# Patient Record
Sex: Male | Born: 1990 | Race: Black or African American | Hispanic: No | Marital: Single | Smoking: Former smoker
Health system: Southern US, Community
[De-identification: ages and names within clinical notes are randomized; demographics above are authoritative.]

## PROBLEM LIST (undated history)

## (undated) DIAGNOSIS — B20 Human immunodeficiency virus [HIV] disease: Secondary | ICD-10-CM

## (undated) DIAGNOSIS — D849 Immunodeficiency, unspecified: Secondary | ICD-10-CM

## (undated) DIAGNOSIS — Z21 Asymptomatic human immunodeficiency virus [HIV] infection status: Secondary | ICD-10-CM

---
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
## 2010-05-11 ENCOUNTER — Emergency Department: Payer: Self-pay | Admitting: Emergency Medicine

## 2012-02-21 ENCOUNTER — Emergency Department: Payer: Self-pay | Admitting: Emergency Medicine

## 2012-10-09 ENCOUNTER — Emergency Department: Payer: Self-pay | Admitting: Emergency Medicine

## 2013-09-02 ENCOUNTER — Emergency Department: Payer: Self-pay | Admitting: Emergency Medicine

## 2013-09-02 LAB — CBC WITH DIFFERENTIAL/PLATELET
Comment - H1-Com1: NORMAL
HCT: 44.6 % (ref 40.0–52.0)
HGB: 14.7 g/dL (ref 13.0–18.0)
Lymphocytes: 51 %
MCH: 29.9 pg (ref 26.0–34.0)
MCHC: 33 g/dL (ref 32.0–36.0)
MCV: 91 fL (ref 80–100)
Monocytes: 8 %
PLATELETS: 172 10*3/uL (ref 150–440)
RBC: 4.91 10*6/uL (ref 4.40–5.90)
RDW: 13.7 % (ref 11.5–14.5)
Segmented Neutrophils: 34 %
Variant Lymphocyte - H1-Rlymph: 7 %
WBC: 5.5 10*3/uL (ref 3.8–10.6)

## 2013-09-02 LAB — COMPREHENSIVE METABOLIC PANEL
ALT: 16 U/L
Albumin: 3.8 g/dL (ref 3.4–5.0)
Alkaline Phosphatase: 70 U/L
Anion Gap: 4 — ABNORMAL LOW (ref 7–16)
BUN: 13 mg/dL (ref 7–18)
Bilirubin,Total: 0.3 mg/dL (ref 0.2–1.0)
CHLORIDE: 107 mmol/L (ref 98–107)
Calcium, Total: 8.5 mg/dL (ref 8.5–10.1)
Co2: 25 mmol/L (ref 21–32)
Creatinine: 0.94 mg/dL (ref 0.60–1.30)
EGFR (African American): >60
Glucose: 87 mg/dL (ref 65–99)
OSMOLALITY: 271 (ref 275–301)
Potassium: 3.8 mmol/L (ref 3.5–5.1)
SGOT(AST): 13 U/L — ABNORMAL LOW (ref 15–37)
Sodium: 136 mmol/L (ref 136–145)
TOTAL PROTEIN: 8 g/dL (ref 6.4–8.2)

## 2013-09-02 LAB — URINALYSIS, COMPLETE
Bacteria: NONE SEEN
Bilirubin,UR: NEGATIVE
Glucose,UR: NEGATIVE mg/dL (ref 0–75)
KETONE: NEGATIVE
Leukocyte Esterase: NEGATIVE
NITRITE: NEGATIVE
PH: 5 (ref 4.5–8.0)
PROTEIN: NEGATIVE
RBC,UR: 18 /HPF (ref 0–5)
Specific Gravity: 1.027 (ref 1.003–1.030)
Squamous Epithelial: NONE SEEN
WBC UR: 1 /HPF (ref 0–5)

## 2014-07-08 ENCOUNTER — Emergency Department: Payer: Self-pay

## 2014-07-08 ENCOUNTER — Emergency Department
Admission: EM | Admit: 2014-07-08 | Discharge: 2014-07-08 | Disposition: A | Discharge status: Home / Self Care | Payer: Self-pay | Attending: Emergency Medicine | Admitting: Emergency Medicine

## 2014-07-08 ENCOUNTER — Encounter: Payer: Self-pay | Admitting: Urgent Care

## 2014-07-08 DIAGNOSIS — K5900 Constipation, unspecified: Secondary | ICD-10-CM | POA: Insufficient documentation

## 2014-07-08 DIAGNOSIS — Z720 Tobacco use: Secondary | ICD-10-CM | POA: Insufficient documentation

## 2014-07-08 NOTE — ED Notes (Signed)
Patient presents with c/o constipation for over 2 months. Patient states, "Some will come out then some other stuff will come out. When my booty hole tries to close it hurts real bad." Denies rectal bleeding. NOS reported at this time.

## 2014-07-08 NOTE — Discharge Instructions (Signed)
Constipation Constipation is when a person:  Poops (has a bowel movement) less than 3 times a week.  Has a hard time pooping.  Has poop that is dry, hard, or bigger than normal. HOME CARE   Eat foods with a lot of fiber in them. This includes fruits, vegetables, beans, and whole grains such as brown rice.  Avoid fatty foods and foods with a lot of sugar. This includes french fries, hamburgers, cookies, candy, and soda.  If you are not getting enough fiber from food, take products with added fiber in them (supplements).  Drink enough fluid to keep your pee (urine) clear or pale yellow.  Exercise on a regular basis, or as told by your doctor.  Go to the restroom when you feel like you need to poop. Do not hold it.  Only take medicine as told by your doctor. Do not take medicines that help you poop (laxatives) without talking to your doctor first. GET HELP RIGHT AWAY IF:   You have bright red blood in your poop (stool).  Your constipation lasts more than 4 days or gets worse.  You have belly (abdominal) or butt (rectal) pain.  You have thin poop (as thin as a pencil).  You lose weight, and it cannot be explained. MAKE SURE YOU:   Understand these instructions.  Will watch your condition.  Will get help right away if you are not doing well or get worse. Document Released: 06/06/2007 Document Revised: 12/23/2012 Document Reviewed: 09/29/2012 [facility] Patient Information 2015 [place]. This information is not intended to replace advice given to you by your health care provider. Make sure you discuss any questions you have with your health care provider.     INCREASE FLUIDS MIRALAX EVERY DAY WITH WATER, JUICE OR COFFEE FOLLOW UP WITH DR. OH    YOU WILL NEED TO CALL FOR AN APPOINTMENT

## 2014-07-08 NOTE — ED Provider Notes (Signed)
[facility] Emergency Department Provider Note  ____________________________________________  Time seen: 7:35 AM  I have reviewed the triage vital signs and the nursing notes.   HISTORY  Chief Complaint Constipation   HPI Anthony Nunez is a 24 y.o. male is here with complaint of constipation for over 2 months. Patient states his last bowel movement was around midnight prior to his arrival in the emergency room. He states it was normal at that time. He states that he eats plenty of that vegetables and fruitsalong with drinking water. He denies any abdominal pain. He denies any rectal bleeding. He states he has not been seen by provider prior to this morning. Nothing makes it better or worse. Currently he states his discomfort is a 6/10.   History reviewed. No pertinent past medical history.  There are no active problems to display for this patient.   History reviewed. No pertinent past surgical history.  No current outpatient prescriptions on file.  Allergies Review of patient's allergies indicates no known allergies.  No family history on file.  Social History History  Substance Use Topics  . Smoking status: Current Every Day Smoker  . Smokeless tobacco: Not on file  . Alcohol Use: No    Review of Systems Constitutional: No fever/chills Eyes: No visual changes. ENT: No sore throat. Cardiovascular: Denies chest pain. Respiratory: Denies shortness of breath. Gastrointestinal: No abdominal pain.  No nausea, no vomiting.  No diarrhea.  Positive constipation. Genitourinary: Negative for dysuria. Musculoskeletal: Negative for back pain. Skin: Negative for rash. Neurological: Negative for headaches, focal weakness or numbness.  10-point ROS otherwise negative.  ____________________________________________   PHYSICAL EXAM:  VITAL SIGNS: ED Triage Vitals  Enc Vitals Group     BP 07/08/14 0203 128/67 mmHg     Pulse Rate 07/08/14 0203 60     Resp 07/08/14 0203 14     Temp 07/08/14 0203 98.4 F (36.9 C)     Temp Source 07/08/14 0203 Oral     SpO2 07/08/14 0203 99 %     Weight 07/08/14 0203 170 lb (77.111 kg)     Height 07/08/14 0203 5\' 10"  (1.778 m)     Head Cir --      Peak Flow --      Pain Score 07/08/14 0203 6     Pain Loc --      Pain Edu? --      Excl. in GC? --     Constitutional: Alert and oriented. Well appearing and in no acute distress. Eyes: Conjunctivae are normal. PERRL. EOMI. Head: Atraumatic. Nose: No congestion/rhinnorhea. Neck: No stridor.   Cardiovascular: Normal rate, regular rhythm. Grossly normal heart sounds.  Good peripheral circulation. Respiratory: Normal respiratory effort.  No retractions. Lungs CTAB. Gastrointestinal: Soft and nontender. No distention. No abdominal bruits. No CVA tenderness. Bowel sounds active in 4 quadrants 4. Rectal exam was performed. No external hemorrhoids were noted. No stool was noted in the rectal vault. Minimal discomfort. Heme occult was negative. Musculoskeletal: No lower extremity tenderness nor edema.  No joint effusions. Neurologic:  Normal speech and language. No gross focal neurologic deficits are appreciated. Speech is normal. No gait instability. Skin:  Skin is warm, dry and intact. No rash noted. Psychiatric: Mood and affect are normal. Speech and behavior are normal.  ____________________________________________   LABS (all labs ordered are listed, but only abnormal results are displayed)  Labs Reviewed - No data to display  RADIOLOGY  KUB x-ray per radiologist shows no  evidence of excess stool. I, Tommi Rumps, personally viewed and evaluated these images as part of my medical decision making.  ____________________________________________   PROCEDURES  Procedure(s) performed: None  Critical Care performed: No  ____________________________________________   INITIAL IMPRESSION / ASSESSMENT AND PLAN / ED COURSE  Pertinent labs &  imaging results that were available during my care of the patient were reviewed by me and considered in my medical decision making (see chart for details  patient was encouraged to eat more vegetables and fruit. He is also told to increase water and also purchase some MiraLAX over-the-counter to begin taking daily. He was given the name of the gastroenterologist on call to follow-up as well.   FINAL CLINICAL IMPRESSION(S) / ED DIAGNOSES  Final diagnoses:  Constipation, unspecified constipation type      Tommi Rumps, PA-C 07/08/14 1132  Tommi Rumps, PA-C 07/08/14 1133  Sharman Cheek, MD 07/08/14 1535

## 2014-08-03 ENCOUNTER — Emergency Department
Admission: EM | Admit: 2014-08-03 | Discharge: 2014-08-03 | Disposition: A | Discharge status: Home / Self Care | Payer: Self-pay | Attending: Emergency Medicine | Admitting: Emergency Medicine

## 2014-08-03 ENCOUNTER — Encounter: Payer: Self-pay | Admitting: *Deleted

## 2014-08-03 DIAGNOSIS — Z720 Tobacco use: Secondary | ICD-10-CM | POA: Insufficient documentation

## 2014-08-03 DIAGNOSIS — N419 Inflammatory disease of prostate, unspecified: Secondary | ICD-10-CM | POA: Insufficient documentation

## 2014-08-03 LAB — URINALYSIS COMPLETE WITH MICROSCOPIC (ARMC ONLY)
Bacteria, UA: NONE SEEN
Bilirubin Urine: NEGATIVE
Glucose, UA: NEGATIVE mg/dL
Hgb urine dipstick: NEGATIVE
Ketones, ur: NEGATIVE mg/dL
Leukocytes, UA: NEGATIVE
NITRITE: NEGATIVE
PH: 5 (ref 5.0–8.0)
PROTEIN: NEGATIVE mg/dL
SQUAMOUS EPITHELIAL / LPF: NONE SEEN
Specific Gravity, Urine: 1.02 (ref 1.005–1.030)

## 2014-08-03 LAB — CHLAMYDIA/NGC RT PCR (ARMC ONLY)
Chlamydia Tr: NOT DETECTED
N gonorrhoeae: NOT DETECTED

## 2014-08-03 MED ORDER — LIDOCAINE HCL (PF) 1 % IJ SOLN
2.1000 mL | Freq: Once | INTRAMUSCULAR | Status: AC
  Administered 2014-08-03: 2.1 mL
  Filled 2014-08-03: qty 5

## 2014-08-03 MED ORDER — AZITHROMYCIN 250 MG PO TABS
1000.0000 mg | ORAL_TABLET | Freq: Once | ORAL | Status: AC
  Administered 2014-08-03: 1000 mg via ORAL
  Filled 2014-08-03: qty 4

## 2014-08-03 MED ORDER — CEFTRIAXONE SODIUM 1 G IJ SOLR
500.0000 mg | Freq: Once | INTRAMUSCULAR | Status: AC
  Administered 2014-08-03: 500 mg via INTRAMUSCULAR
  Filled 2014-08-03: qty 10

## 2014-08-03 MED ORDER — CIPROFLOXACIN HCL 500 MG PO TABS: 500.0000 mg | ORAL_TABLET | Freq: Two times a day (BID) | ORAL | Status: AC

## 2014-08-03 NOTE — Discharge Instructions (Signed)

## 2014-08-03 NOTE — ED Provider Notes (Signed)
[facility] Emergency Department Provider Note ____________________________________________  Time seen: Approximately 6:57 PM  I have reviewed the triage vital signs and the nursing notes.   HISTORY  Chief Complaint Rectal Pain   HPI Anthony Nunez is a 24 y.o. male who presents to the emergency department for pain in the rectum.He states he was here in July and was told that he likely had hemorrhoids and given prescription for Mirilax. He reports occasional dysuria as well. He reports no exposure to HIV but no other STDs. He states that he has male sexual partners. He denies fever.   History reviewed. No pertinent past medical history.  There are no active problems to display for this patient.   History reviewed. No pertinent past surgical history.  Current Outpatient Rx  Name  Route  Sig  Dispense  Refill  . ciprofloxacin (CIPRO) 500 MG tablet   Oral   Take 1 tablet (500 mg total) by mouth 2 (two) times daily.   28 tablet   0     Allergies Review of patient's allergies indicates no known allergies.  No family history on file.  Social History History  Substance Use Topics  . Smoking status: Current Every Day Smoker  . Smokeless tobacco: Not on file  . Alcohol Use: No    Review of Systems Constitutional: No fever/occasional chills Eyes: No visual changes. ENT: No sore throat. Cardiovascular: Denies chest pain. Respiratory: Denies shortness of breath. Gastrointestinal: No abdominal pain.  No nausea, no vomiting.  No diarrhea.  No constipation. Genitourinary: Positive for dysuria. Pain is inside the rectum and feels like a very dull ache. Musculoskeletal: Negative for back pain. Skin: Negative for rash. Neurological: Negative for headaches, focal weakness or numbness.  10-point ROS otherwise negative.  ____________________________________________   PHYSICAL EXAM:  VITAL SIGNS: ED Triage Vitals  Enc Vitals Group     BP  08/03/14 1723 159/90 mmHg     Pulse Rate 08/03/14 1723 60     Resp 08/03/14 1723 16     Temp 08/03/14 1723 98.5 F (36.9 C)     Temp Source 08/03/14 1723 Oral     SpO2 08/03/14 1723 100 %     Weight 08/03/14 1723 165 lb (74.844 kg)     Height 08/03/14 1723  (1.778 m)     Head Cir --      Peak Flow --      Pain Score 08/03/14 1724 8     Pain Loc --      Pain Edu? --      Excl. in GC? --     Constitutional: Alert and oriented. Well appearing and in no acute distress. Eyes: Conjunctivae are normal. PERRL. EOMI. Head: Atraumatic. Nose: No congestion/rhinnorhea. Mouth/Throat: Mucous membranes are moist.  Oropharynx non-erythematous. Neck: No stridor.   Cardiovascular: Normal rate, regular rhythm. Grossly normal heart sounds.  Good peripheral circulation. Respiratory: Normal respiratory effort.  No retractions. Lungs CTAB. Gastrointestinal: Soft and nontender. No distention. No abdominal bruits. No CVA tenderness. Rectal: Prostate tenderness on rectal exam. Musculoskeletal: No lower extremity tenderness nor edema.  No joint effusions. Neurologic:  Normal speech and language. No gross focal neurologic deficits are appreciated. No gait instability. Skin:  Skin is warm, dry and intact. No rash noted. Psychiatric: Mood and affect are normal. Speech and behavior are normal.  ____________________________________________   LABS (all labs ordered are listed, but only abnormal results are displayed)  Labs Reviewed  URINALYSIS COMPLETEWITH MICROSCOPIC ([facility] ONLY) -  Abnormal; Notable for the following:    Color, Urine YELLOW (*)    APPearance CLEAR (*)    All other components within normal limits  CHLAMYDIA/NGC RT PCR ([facility] ONLY)   ____________________________________________  EKG   ____________________________________________  RADIOLOGY   ____________________________________________   PROCEDURES  Procedure(s) performed: None  Critical Care performed:  No  ____________________________________________   INITIAL IMPRESSION / ASSESSMENT AND PLAN / ED COURSE  Pertinent labs & imaging results that were available during my care of the patient were reviewed by me and considered in my medical decision making (see chart for details).  Patient was treated with 1 g of azithromycin and 500 mg of Rocephin IM in the emergency department. He will also be started on ciprofloxacin 500 mg twice a day for the next 2 weeks. He is to follow up with urology for symptoms that are not improving over the next few days. He is to return to the emergency department for symptoms that change or worsen if he isn't able to schedule an appointment. ____________________________________________   FINAL CLINICAL IMPRESSION(S) / ED DIAGNOSES  Final diagnoses:  Prostatitis, unspecified prostatitis type      Chinita Pester, FNP 08/03/14 2159  Minna Antis, MD 08/03/14 2229

## 2014-08-03 NOTE — ED Notes (Signed)
Pt states that he has had on and off rectal pain for about 2 months. Pt states that after he has a bowel movement it hurts worse. Pt seen here for the same previously, symptoms not better.

## 2014-08-03 NOTE — ED Notes (Signed)
Pt reports rectal pain anytime he sneezes or coughs.

## 2014-08-03 NOTE — ED Notes (Signed)
Patient states he was here in July for same. States he does not think it is hemorrhoids. Was told then to increase fiber in diet. States he takes miralax. States pain is still there. Denies any blood in stool.

## 2015-01-22 ENCOUNTER — Encounter: Payer: Self-pay | Admitting: *Deleted

## 2015-01-22 ENCOUNTER — Emergency Department
Admission: EM | Admit: 2015-01-22 | Discharge: 2015-01-22 | Disposition: A | Discharge status: Home / Self Care | Payer: Self-pay | Attending: Emergency Medicine | Admitting: Emergency Medicine

## 2015-01-22 DIAGNOSIS — J40 Bronchitis, not specified as acute or chronic: Secondary | ICD-10-CM | POA: Insufficient documentation

## 2015-01-22 DIAGNOSIS — F17210 Nicotine dependence, cigarettes, uncomplicated: Secondary | ICD-10-CM | POA: Insufficient documentation

## 2015-01-22 MED ORDER — BENZONATATE 100 MG PO CAPS: 100.0000 mg | ORAL_CAPSULE | Freq: Four times a day (QID) | ORAL | Status: DC | PRN

## 2015-01-22 MED ORDER — ALBUTEROL SULFATE HFA 108 (90 BASE) MCG/ACT IN AERS: 2.0000 | INHALATION_SPRAY | Freq: Four times a day (QID) | RESPIRATORY_TRACT | Status: DC | PRN

## 2015-01-22 NOTE — Discharge Instructions (Signed)
Please seek medical attention for any high fevers, chest pain, shortness of breath, change in behavior, persistent vomiting, bloody stool or any other new or concerning symptoms. ° ° °Upper Respiratory Infection, Adult °Most upper respiratory infections (URIs) are a viral infection of the air passages leading to the lungs. A URI affects the nose, throat, and upper air passages. The most common type of URI is nasopharyngitis and is typically referred to as "the common cold." °URIs run their course and usually go away on their own. Most of the time, a URI does not require medical attention, but sometimes a bacterial infection in the upper airways can follow a viral infection. This is called a secondary infection. Sinus and middle ear infections are common types of secondary upper respiratory infections. °Bacterial pneumonia can also complicate a URI. A URI can worsen asthma and chronic obstructive pulmonary disease (COPD). Sometimes, these complications can require emergency medical care and may be life threatening.  °CAUSES °Almost all URIs are caused by viruses. A virus is a type of germ and can spread from one person to another.  °RISKS FACTORS °You may be at risk for a URI if:  °· You smoke.   °· You have chronic heart or lung disease. °· You have a weakened defense (immune) system.   °· You are very young or very old.   °· You have nasal allergies or asthma. °· You work in crowded or poorly ventilated areas. °· You work in health care facilities or schools. °SIGNS AND SYMPTOMS  °Symptoms typically develop 2-3 days after you come in contact with a cold virus. Most viral URIs last 7-10 days. However, viral URIs from the influenza virus (flu virus) can last 14-18 days and are typically more severe. Symptoms may include:  °· Runny or stuffy (congested) nose.   °· Sneezing.   °· Cough.   °· Sore throat.   °· Headache.   °· Fatigue.   °· Fever.   °· Loss of appetite.   °· Pain in your forehead, behind your eyes, and  over your cheekbones (sinus pain). °· Muscle aches.   °DIAGNOSIS  °Your health care provider may diagnose a URI by: °· Physical exam. °· Tests to check that your symptoms are not due to another condition such as: °¨ Strep throat. °¨ Sinusitis. °¨ Pneumonia. °¨ Asthma. °TREATMENT  °A URI goes away on its own with time. It cannot be cured with medicines, but medicines may be prescribed or recommended to relieve symptoms. Medicines may help: °· Reduce your fever. °· Reduce your cough. °· Relieve nasal congestion. °HOME CARE INSTRUCTIONS  °· Take medicines only as directed by your health care provider.   °· Gargle warm saltwater or take cough drops to comfort your throat as directed by your health care provider. °· Use a warm mist humidifier or inhale steam from a shower to increase air moisture. This may make it easier to breathe. °· Drink enough fluid to keep your urine clear or pale yellow.   °· Eat soups and other clear broths and maintain good nutrition.   °· Rest as needed.   °· Return to work when your temperature has returned to normal or as your health care provider advises. You may need to stay home longer to avoid infecting others. You can also use a face mask and careful hand washing to prevent spread of the virus. °· Increase the usage of your inhaler if you have asthma.   °· Do not use any tobacco products, including cigarettes, chewing tobacco, or electronic cigarettes. If you need help quitting, ask your health care   provider. °PREVENTION  °The best way to protect yourself from getting a cold is to practice good hygiene.  °· Avoid oral or hand contact with people with cold symptoms.   °· Wash your hands often if contact occurs.   °There is no clear evidence that vitamin C, vitamin E, echinacea, or exercise reduces the chance of developing a cold. However, it is always recommended to get plenty of rest, exercise, and practice good nutrition.  °SEEK MEDICAL CARE IF:  °· You are getting worse rather than  better.   °· Your symptoms are not controlled by medicine.   °· You have chills. °· You have worsening shortness of breath. °· You have brown or red mucus. °· You have yellow or brown nasal discharge. °· You have pain in your face, especially when you bend forward. °· You have a fever. °· You have swollen neck glands. °· You have pain while swallowing. °· You have white areas in the back of your throat. °SEEK IMMEDIATE MEDICAL CARE IF:  °· You have severe or persistent: °¨ Headache. °¨ Ear pain. °¨ Sinus pain. °¨ Chest pain. °· You have chronic lung disease and any of the following: °¨ Wheezing. °¨ Prolonged cough. °¨ Coughing up blood. °¨ A change in your usual mucus. °· You have a stiff neck. °· You have changes in your: °¨ Vision. °¨ Hearing. °¨ Thinking. °¨ Mood. °MAKE SURE YOU:  °· Understand these instructions. °· Will watch your condition. °· Will get help right away if you are not doing well or get worse. °  °This information is not intended to replace advice given to you by your health care provider. Make sure you discuss any questions you have with your health care provider. °  °Document Released: 06/13/2000 Document Revised: 05/04/2014 Document Reviewed: 03/25/2013 °Elsevier Interactive Patient Education ©2016 [employer] ° °

## 2015-01-22 NOTE — ED Provider Notes (Signed)
[facility] Emergency Department Provider Note   ____________________________________________  Time seen: 0218  I have reviewed the triage vital signs and the nursing notes.   HISTORY  Chief Complaint Cough   History limited by: Not Limited   HPI Anthony Nunez is a 25 y.o. male who presents to the emergency department today because of concerns for cough and congestion. He states that the symptoms have been going on for 1 week. He states that the coughing is intermittent. He has brought up some nonbloody phlegm. He will have some posttussive emesis. He denies any blood in his emesis. He denies any chest pain or significant shortness of breath. He states he does have a some associated right ring decreased. He has tried to use the waxing medications without great success. No fevers. No chest pain.   History reviewed. No pertinent past medical history.  There are no active problems to display for this patient.   History reviewed. No pertinent past surgical history.  No current outpatient prescriptions on file.  Allergies Review of patient's allergies indicates no known allergies.  History reviewed. No pertinent family history.  Social History Social History  Substance Use Topics  . Smoking status: Current Every Day Smoker    Types: Cigarettes  . Smokeless tobacco: None  . Alcohol Use: No    Review of Systems  Constitutional: Negative for fever. Cardiovascular: Negative for chest pain. Positive for cough and congestion. Respiratory: Negative for shortness of breath. Gastrointestinal: Negative for abdominal pain, vomiting and diarrhea. Neurological: Negative for headaches, focal weakness or numbness.  10-point ROS otherwise negative.  ____________________________________________   PHYSICAL EXAM:  VITAL SIGNS: ED Triage Vitals  Enc Vitals Group     BP 01/22/15 0051 136/70 mmHg     Pulse Rate 01/22/15 0051 63     Resp 01/22/15 0051  18     Temp 01/22/15 0051 98.1 F (36.7 C)     Temp Source 01/22/15 0051 Oral     SpO2 01/22/15 0051 95 %     Weight 01/22/15 0051 170 lb (77.111 kg)     Height 01/22/15 0051  (1.778 m)     Head Cir --      Peak Flow --      Pain Score 01/22/15 0052 0   Constitutional: Alert and oriented. Well appearing and in no distress. Eyes: Conjunctivae are normal. PERRL. Normal extraocular movements. ENT   Head: Normocephalic and atraumatic.   Nose: No congestion/rhinnorhea.   Mouth/Throat: Mucous membranes are moist.   Neck: No stridor. Hematological/Lymphatic/Immunilogical: No cervical lymphadenopathy. Cardiovascular: Normal rate, regular rhythm.  No murmurs, rubs, or gallops. Respiratory: Normal respiratory effort without tachypnea nor retractions. Breath sounds are clear and equal bilaterally. No wheezes/rales/rhonchi. Gastrointestinal: Soft and nontender. No distention.  Genitourinary: Deferred Musculoskeletal: Normal range of motion in all extremities. No joint effusions.  No lower extremity tenderness nor edema. Neurologic:  Normal speech and language. No gross focal neurologic deficits are appreciated.  Skin:  Skin is warm, dry and intact. No rash noted. Psychiatric: Mood and affect are normal. Speech and behavior are normal. Patient exhibits appropriate insight and judgment.  ____________________________________________    LABS (pertinent positives/negatives)  None  ____________________________________________   EKG  None  ____________________________________________    RADIOLOGY  None   ____________________________________________   PROCEDURES  Procedure(s) performed: None  Critical Care performed: No  ____________________________________________   INITIAL IMPRESSION / ASSESSMENT AND PLAN / ED COURSE  Pertinent labs & imaging results that were  available during my care of the patient were reviewed by me and considered in my medical  decision making (see chart for details).  Patient presented to the emergency department today because of concerns for cough congestion and some hearing decrease. Physical exam is benign. At this point given the patient is a smoker think bronchitis likely. Will give Tessalon Perles as well as an albuterol inhaler. Primary care follow-up.  ____________________________________________   FINAL CLINICAL IMPRESSION(S) / ED DIAGNOSES  Final diagnoses:  Bronchitis     Phineas Semen, MD 01/22/15 [phone]

## 2015-01-22 NOTE — ED Notes (Signed)
Pt c/o congestion x1week, denies any pain or fever. A&O

## 2015-01-22 NOTE — ED Notes (Signed)
Pt c/o cough that makes him gag and vomit. Pt c/o R ear hearing loss. Pt c/o fever but never took temperature at home.  Pt has had no change in appetite, ambulatory w/o respiratory distress to triage, able to complete full sentences, drove self to ED tonight.

## 2015-02-21 ENCOUNTER — Emergency Department
Admission: EM | Admit: 2015-02-21 | Discharge: 2015-02-21 | Disposition: A | Discharge status: Home / Self Care | Payer: Self-pay | Attending: Emergency Medicine | Admitting: Emergency Medicine

## 2015-02-21 ENCOUNTER — Emergency Department: Payer: Self-pay

## 2015-02-21 ENCOUNTER — Encounter: Payer: Self-pay | Admitting: Emergency Medicine

## 2015-02-21 DIAGNOSIS — R319 Hematuria, unspecified: Secondary | ICD-10-CM | POA: Insufficient documentation

## 2015-02-21 DIAGNOSIS — R809 Proteinuria, unspecified: Secondary | ICD-10-CM | POA: Insufficient documentation

## 2015-02-21 DIAGNOSIS — J208 Acute bronchitis due to other specified organisms: Secondary | ICD-10-CM | POA: Insufficient documentation

## 2015-02-21 DIAGNOSIS — Z87891 Personal history of nicotine dependence: Secondary | ICD-10-CM | POA: Insufficient documentation

## 2015-02-21 LAB — CBC
HCT: 47.7 % (ref 40.0–52.0)
HEMOGLOBIN: 16 g/dL (ref 13.0–18.0)
MCH: 32 pg (ref 26.0–34.0)
MCHC: 33.5 g/dL (ref 32.0–36.0)
MCV: 95.4 fL (ref 80.0–100.0)
Platelets: 162 10*3/uL (ref 150–440)
RBC: 5 MIL/uL (ref 4.40–5.90)
RDW: 14.3 % (ref 11.5–14.5)
WBC: 5.3 10*3/uL (ref 3.8–10.6)

## 2015-02-21 LAB — URINALYSIS COMPLETE WITH MICROSCOPIC (ARMC ONLY)
Bacteria, UA: NONE SEEN
Bilirubin Urine: NEGATIVE
GLUCOSE, UA: NEGATIVE mg/dL
Leukocytes, UA: NEGATIVE
NITRITE: NEGATIVE
Protein, ur: 100 mg/dL — AB
SPECIFIC GRAVITY, URINE: 1.034 — AB (ref 1.005–1.030)
pH: 6 (ref 5.0–8.0)

## 2015-02-21 LAB — COMPREHENSIVE METABOLIC PANEL
ALBUMIN: 4.5 g/dL (ref 3.5–5.0)
ALK PHOS: 82 U/L (ref 38–126)
ALT: 16 U/L — ABNORMAL LOW (ref 17–63)
ANION GAP: 5 (ref 5–15)
AST: 18 U/L (ref 15–41)
BILIRUBIN TOTAL: 0.5 mg/dL (ref 0.3–1.2)
BUN: 14 mg/dL (ref 6–20)
CO2: 29 mmol/L (ref 22–32)
Calcium: 9.5 mg/dL (ref 8.9–10.3)
Chloride: 103 mmol/L (ref 101–111)
Creatinine, Ser: 1.09 mg/dL (ref 0.61–1.24)
GFR calc Af Amer: >60 mL/min (ref >60)
GFR calc non Af Amer: >60 mL/min (ref >60)
GLUCOSE: 119 mg/dL — AB (ref 65–99)
Potassium: 4 mmol/L (ref 3.5–5.1)
Sodium: 137 mmol/L (ref 135–145)
TOTAL PROTEIN: 8.5 g/dL — AB (ref 6.5–8.1)

## 2015-02-21 LAB — RAPID INFLUENZA A&B ANTIGENS
Influenza A (ARMC): NEGATIVE
Influenza B (ARMC): NEGATIVE

## 2015-02-21 LAB — LIPASE, BLOOD: Lipase: 23 U/L (ref 11–51)

## 2015-02-21 MED ORDER — IPRATROPIUM-ALBUTEROL 0.5-2.5 (3) MG/3ML IN SOLN
3.0000 mL | Freq: Once | RESPIRATORY_TRACT | Status: AC
  Administered 2015-02-21: 3 mL via RESPIRATORY_TRACT
  Filled 2015-02-21 (×2): qty 3

## 2015-02-21 MED ORDER — SODIUM CHLORIDE 0.9 % IV BOLUS (SEPSIS)
500.0000 mL | Freq: Once | INTRAVENOUS | Status: AC
  Administered 2015-02-21: 500 mL via INTRAVENOUS

## 2015-02-21 MED ORDER — ACETAMINOPHEN 325 MG PO TABS
650.0000 mg | ORAL_TABLET | Freq: Once | ORAL | Status: AC
  Administered 2015-02-21: 650 mg via ORAL
  Filled 2015-02-21: qty 2

## 2015-02-21 MED ORDER — CLONIDINE HCL 0.1 MG PO TABS
0.1000 mg | ORAL_TABLET | Freq: Once | ORAL | Status: DC
  Filled 2015-02-21 (×2): qty 1

## 2015-02-21 MED ORDER — BENZONATATE 100 MG PO CAPS: 100.0000 mg | ORAL_CAPSULE | Freq: Three times a day (TID) | ORAL | Status: AC | PRN

## 2015-02-21 MED ORDER — ALBUTEROL SULFATE HFA 108 (90 BASE) MCG/ACT IN AERS: 2.0000 | INHALATION_SPRAY | Freq: Four times a day (QID) | RESPIRATORY_TRACT | Status: AC | PRN

## 2015-02-21 NOTE — Discharge Instructions (Signed)
Acute Bronchitis Bronchitis is inflammation of the airways that extend from the windpipe into the lungs (bronchi). The inflammation often causes mucus to develop. This leads to a cough, which is the most common symptom of bronchitis.  In acute bronchitis, the condition usually develops suddenly and goes away over time, usually in a couple weeks. Smoking, allergies, and asthma can make bronchitis worse. Repeated episodes of bronchitis may cause further lung problems.  CAUSES Acute bronchitis is most often caused by the same virus that causes a cold. The virus can spread from person to person (contagious) through coughing, sneezing, and touching contaminated objects. SIGNS AND SYMPTOMS   Cough.   Fever.   Coughing up mucus.   Body aches.   Chest congestion.   Chills.   Shortness of breath.   Sore throat.  DIAGNOSIS  Acute bronchitis is usually diagnosed through a physical exam. Your health care provider will also ask you questions about your medical history. Tests, such as chest X-rays, are sometimes done to rule out other conditions.  TREATMENT  Acute bronchitis usually goes away in a couple weeks. Oftentimes, no medical treatment is necessary. Medicines are sometimes given for relief of fever or cough. Antibiotic medicines are usually not needed but may be prescribed in certain situations. In some cases, an inhaler may be recommended to help reduce shortness of breath and control the cough. A cool mist vaporizer may also be used to help thin bronchial secretions and make it easier to clear the chest.  HOME CARE INSTRUCTIONS  Get plenty of rest.   Drink enough fluids to keep your urine clear or pale yellow (unless you have a medical condition that requires fluid restriction). Increasing fluids may help thin your respiratory secretions (sputum) and reduce chest congestion, and it will prevent dehydration.   Take medicines only as directed by your health care provider.  If  you were prescribed an antibiotic medicine, finish it all even if you start to feel better.  Avoid smoking and secondhand smoke. Exposure to cigarette smoke or irritating chemicals will make bronchitis worse. If you are a smoker, consider using nicotine gum or skin patches to help control withdrawal symptoms. Quitting smoking will help your lungs heal faster.   Reduce the chances of another bout of acute bronchitis by washing your hands frequently, avoiding people with cold symptoms, and trying not to touch your hands to your mouth, nose, or eyes.   Keep all follow-up visits as directed by your health care provider.  SEEK MEDICAL CARE IF: Your symptoms do not improve after 1 week of treatment.  SEEK IMMEDIATE MEDICAL CARE IF:  You develop an increased fever or chills.   You have chest pain.   You have severe shortness of breath.  You have bloody sputum.   You develop dehydration.  You faint or repeatedly feel like you are going to pass out.  You develop repeated vomiting.  You develop a severe headache. MAKE SURE YOU:   Understand these instructions.  Will watch your condition.  Will get help right away if you are not doing well or get worse.   This information is not intended to replace advice given to you by your health care provider. Make sure you discuss any questions you have with your health care provider.   Document Released: 01/26/2004 Document Revised: 01/08/2014 Document Reviewed: 06/10/2012 Elsevier Interactive Patient Education [employer].  Please return immediately if condition worsens. Please contact her primary physician or the physician you were given for  referral. If you have any specialist physicians involved in her treatment and plan please also contact them. Thank you for using [facility]. Please drink plenty of fluids and take over-the-counter Tylenol for fever and body aches. Please contact her primary physician  due to the blood did send her urine and also the protein. This will need to be followed on an outpatient basis.

## 2015-02-21 NOTE — ED Notes (Signed)
Patient ambulatory to triage with steady gait, without difficulty or distress noted; pt reports since last night having body aches, N/V, HA, nonprod cough

## 2015-02-21 NOTE — ED Notes (Signed)
Mask applied to pt with instructions to wear

## 2015-02-21 NOTE — ED Provider Notes (Signed)
Anthony Seen: Approximately 2020  I have reviewed the triage notes  Chief Complaint: Generalized Body Aches; Headache; and Emesis   History of Present Illness: Anthony Nunez is a 25 y.o. male *who presents with a viral-type complaints with nausea with occasional vomiting dry nonproductive cough. He states that he developed a headache today mostly associated with the cough. He presents with a low-grade fever here in the triage area at [facility]. He states that he's had some shortness of breath and has been diagnosed with asthma-type illness before in the past. He states he did well with a previous be prescribed albuterol. He denies any chest or back pain. He denies any abdominal pain. He denies any melena or hematochezia diarrhea.   History reviewed. No pertinent past medical history.  There are no active problems to display for this patient.   History reviewed. No pertinent past surgical history.  History reviewed. No pertinent past surgical history.  Current Outpatient Rx  Name  Route  Sig  Dispense  Refill  . albuterol (PROVENTIL HFA;VENTOLIN HFA) 108 (90 Base) MCG/ACT inhaler   Inhalation   Inhale 2 puffs into the lungs every 6 (six) hours as needed for wheezing or shortness of breath.   1 Inhaler   2   . benzonatate (TESSALON PERLES) Nunez MG capsule   Oral   Take 1 capsule (Nunez mg total) by mouth 3 (three) times daily as needed for cough.   30 capsule   0     Allergies:  Review of patient's allergies indicates no known allergies.  Family History: No family history on file.  Social History: Social History  Substance Use Topics  . Smoking status: Former Smoker    Types: Cigarettes  . Smokeless tobacco: None  . Alcohol Use: No     Review of Systems:   10 point review of systems was performed and was otherwise negative:  Constitutional: No fever Eyes: No visual disturbances ENT: No sore throat, ear pain Cardiac: No chest pain Respiratory: No shortness of  breath, wheezing, or stridor Abdomen: No abdominal pain, no vomiting, No diarrhea Endocrine: No weight loss, No night sweats Extremities: No peripheral edema, cyanosis Skin: No rashes, easy bruising Neurologic: No focal weakness, trouble with speech or swollowing Urologic: No dysuria, Hematuria, or urinary frequency   Physical Exam:  ED Triage Vitals  Enc Vitals Group     BP 02/21/15 1951 142/Nunez mmHg     Pulse Rate 02/21/15 1951 103     Resp 02/21/15 1951 20     Temp 02/21/15 1951 Nunez.3 F (37.9 C)     Temp Source 02/21/15 1951 Oral     SpO2 02/21/15 1951 97 %     Weight 02/21/15 1951 175 lb (79.379 kg)     Height 02/21/15 1951  (1.778 m)     Head Cir --      Peak Flow --      Pain Score 02/21/15 1950 9     Pain Loc --      Pain Edu? --      Excl. in GC? --     General: Awake , Alert , and Oriented times 3; GCS 15 Head: Normal cephalic , atraumatic Eyes: Pupils equal , round, reactive to light Nose/Throat: No nasal drainage, patent upper airway without erythema or exudate.  Neck: Supple, Full range of motion, No anterior adenopathy or palpable thyroid masses Lungs: Clear to ascultation without wheezes , rhonchi, or rales Heart: Regular rate, regular rhythm  without murmurs , gallops , or rubs Abdomen: Soft, non tender without rebound, guarding , or rigidity; bowel sounds positive and symmetric in all 4 quadrants. No organomegaly .        Extremities: 2 plus symmetric pulses. No edema, clubbing or cyanosis Neurologic: normal ambulation, Motor symmetric without deficits, sensory intact Skin: warm, dry, no rashes   Labs:   All laboratory work was reviewed including any pertinent negatives or positives listed below:  Labs Reviewed  COMPREHENSIVE METABOLIC PANEL - Abnormal; Notable for the following:    Glucose, Bld 119 (*)    Total Protein 8.5 (*)    ALT 16 (*)    All other components within normal limits  URINALYSIS COMPLETEWITH MICROSCOPIC ([facility] ONLY) -  Abnormal; Notable for the following:    Color, Urine YELLOW (*)    APPearance CLEAR (*)    Ketones, ur 1+ (*)    Specific Gravity, Urine 1.034 (*)    Hgb urine dipstick 3+ (*)    Protein, ur Nunez (*)    Squamous Epithelial / LPF 0-5 (*)    All other components within normal limits  RAPID INFLUENZA A&B ANTIGENS ([facility] ONLY)  LIPASE, BLOOD  CBC   reviewed the patient's laboratory work shows some protein and some blood in his urine.   Radiology:    Narrative:    CLINICAL DATA: 25 year old with acute onset of cough, fever left-sided chest pain and chest congestion which began yesterday.  EXAM: CHEST 2 VIEW  COMPARISON: 05/11/2010, 08/20/2008.  FINDINGS: Cardiomediastinal silhouette unremarkable, unchanged. Lungs clear. Bronchovascular markings normal. Pulmonary vascularity normal. No visible pleural effusions. No pneumothorax. Visualized bony thorax intact.  IMPRESSION: No acute cardiopulmonary disease. Stable examination.   Electronically Signed By: Hulan Saas M.D.     I personally reviewed the radiologic studies   P  ED Course:  Patient's stay here showed symptomatic improvement. He was given a duo neb which helped clear his wheezing and his pulse ox is stable at this point. I felt I would hold off on steroid therapy based on his urinalysis which likely need some follow-up. Patient may have the onset of nephrotic or nephritic syndrome. He's been advisedwith his primary physician for further follow-up of his blood pressure in his urinalysis. His blood pressure decreased after his temperature came down here in emergency department and I felt he did not need any ongoing blood pressure medication at this Anthony but again requires follow-up. He states he's followed by [facility] and he was advised to give them a call in the morning. His chest x-ray does not show any signs of pneumonia and his influenza testing was negative. Patient was prescribed an inhaler  for his wheezing.    Assessment: Acute viral bronchitis Proteinuria Hematuria   Final Clinical Impression:  Final diagnoses:  Acute viral bronchitis     Plan:  Outpatient management Patient was advised to return immediately if condition worsens. Patient was advised to follow up with their primary care physician or other specialized physicians involved in their outpatient care             Jennye Moccasin, MD 02/21/15 2302

## 2016-07-02 ENCOUNTER — Encounter: Payer: Self-pay | Admitting: Emergency Medicine

## 2016-07-02 ENCOUNTER — Emergency Department: Payer: Self-pay

## 2016-07-02 ENCOUNTER — Emergency Department
Admission: EM | Admit: 2016-07-02 | Discharge: 2016-07-02 | Disposition: A | Discharge status: Home / Self Care | Payer: Self-pay | Attending: Emergency Medicine | Admitting: Emergency Medicine

## 2016-07-02 DIAGNOSIS — R51 Headache: Secondary | ICD-10-CM

## 2016-07-02 DIAGNOSIS — Z87891 Personal history of nicotine dependence: Secondary | ICD-10-CM | POA: Insufficient documentation

## 2016-07-02 DIAGNOSIS — G8929 Other chronic pain: Secondary | ICD-10-CM

## 2016-07-02 DIAGNOSIS — Z79899 Other long term (current) drug therapy: Secondary | ICD-10-CM | POA: Insufficient documentation

## 2016-07-02 DIAGNOSIS — G44229 Chronic tension-type headache, not intractable: Secondary | ICD-10-CM | POA: Insufficient documentation

## 2016-07-02 DIAGNOSIS — B20 Human immunodeficiency virus [HIV] disease: Secondary | ICD-10-CM | POA: Insufficient documentation

## 2016-07-02 LAB — CBC WITH DIFFERENTIAL/PLATELET
Basophils Absolute: 0 10*3/uL (ref 0–0.1)
Basophils Relative: 0 %
EOS ABS: 0.2 10*3/uL (ref 0–0.7)
Eosinophils Relative: 2 %
HEMATOCRIT: 42.9 % (ref 40.0–52.0)
HEMOGLOBIN: 14.9 g/dL (ref 13.0–18.0)
Lymphocytes Relative: 51 %
Lymphs Abs: 3.6 10*3/uL (ref 1.0–3.6)
MCH: 32.6 pg (ref 26.0–34.0)
MCHC: 34.8 g/dL (ref 32.0–36.0)
MCV: 93.8 fL (ref 80.0–100.0)
MONOS PCT: 8 %
Monocytes Absolute: 0.6 10*3/uL (ref 0.2–1.0)
NEUTROS ABS: 2.7 10*3/uL (ref 1.4–6.5)
Neutrophils Relative %: 39 %
Platelets: 160 10*3/uL (ref 150–440)
RBC: 4.57 MIL/uL (ref 4.40–5.90)
RDW: 13.8 % (ref 11.5–14.5)
WBC: 7 10*3/uL (ref 3.8–10.6)

## 2016-07-02 LAB — BASIC METABOLIC PANEL
Anion gap: 4 — ABNORMAL LOW (ref 5–15)
BUN: 14 mg/dL (ref 6–20)
CHLORIDE: 105 mmol/L (ref 101–111)
CO2: 32 mmol/L (ref 22–32)
CREATININE: 0.86 mg/dL (ref 0.61–1.24)
Calcium: 9.4 mg/dL (ref 8.9–10.3)
GFR calc non Af Amer: >60 mL/min (ref >60)
Glucose, Bld: 88 mg/dL (ref 65–99)
POTASSIUM: 3.8 mmol/L (ref 3.5–5.1)
Sodium: 141 mmol/L (ref 135–145)

## 2016-07-02 MED ORDER — DIPHENHYDRAMINE HCL 25 MG PO CAPS
ORAL_CAPSULE | ORAL | Status: AC
  Filled 2016-07-02: qty 1

## 2016-07-02 MED ORDER — SODIUM CHLORIDE 0.9 % IV BOLUS (SEPSIS)
1000.0000 mL | Freq: Once | INTRAVENOUS | Status: AC
  Administered 2016-07-02: 1000 mL via INTRAVENOUS

## 2016-07-02 MED ORDER — DIPHENHYDRAMINE HCL 25 MG PO CAPS: 25.0000 mg | ORAL_CAPSULE | Freq: Once | ORAL | Status: DC

## 2016-07-02 MED ORDER — DIPHENHYDRAMINE HCL 25 MG PO CAPS
25.0000 mg | ORAL_CAPSULE | Freq: Once | ORAL | Status: AC
  Administered 2016-07-02: 25 mg via ORAL

## 2016-07-02 MED ORDER — PROCHLORPERAZINE EDISYLATE 5 MG/ML IJ SOLN
10.0000 mg | Freq: Once | INTRAMUSCULAR | Status: AC
  Administered 2016-07-02: 10 mg via INTRAVENOUS
  Filled 2016-07-02: qty 2

## 2016-07-02 NOTE — ED Notes (Signed)
Checked on patient, patient asleep lightly snoring. Patient state he is feeling better and headache is better.

## 2016-07-02 NOTE — ED Notes (Signed)
Patient reports headache for the last two weeks. Patient states it is on the right side at the temple. States he gets hot and feels like he is "going to fall out". Patient a/o x4

## 2016-07-02 NOTE — ED Provider Notes (Addendum)
[facility] Emergency Department Provider Note  ____________________________________________   I have reviewed the triage vital signs and the nursing notes.   HISTORY  Chief Complaint Headache    HPI Anthony Nunez is a 26 y.o. male who presents today complaining of mild headache for 3 weeks. Patient does work in hot conditions. He states he'll take Tylenol and the headache will go away but sometimes it comes back. Right-sided. He denies any numbness or weakness, he denies anyfever or stiff neck. 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 Patient has had off-and-on headaches exactly like this for over a decade and he states this predates his HIV diagnosis. This is his usual headache. Not worst headache of life. Gradual in onset. No fever. His last CT scan however was in 2009 for these headaches. She states the headaches go away when he takes a nap or relaxes but then comes back when he is at work words hot. Denies any change in vision or focal neurologic deficit. States it is his usual mild headache but he wants to get "checked out".   History reviewed. No pertinent past medical history.  There are no active problems to display for this patient.   History reviewed. No pertinent surgical history.  Prior to Admission medications   Medication Sig Start Date End Date Taking? Authorizing Provider  acetaminophen (TYLENOL) 325 MG tablet Take 650 mg by mouth every 6 (six) hours as needed.   Yes [provider]  emtricitabine-tenofovir (TRUVADA) 200-300 MG tablet Take 1 tablet by mouth daily.   Yes [provider]  albuterol (PROVENTIL HFA;VENTOLIN HFA) 108 (90 Base) MCG/ACT inhaler Inhale 2 puffs into the lungs every 6 (six) hours as needed for wheezing or shortness of breath. Patient not taking: Reported on 07/02/2016 02/21/15   Jennye Moccasin, MD     Allergies Patient has no known allergies.  History reviewed. No pertinent family history.  Social History Social History  Substance Use Topics  . Smoking status: Former Smoker    Types: Cigarettes  . Smokeless tobacco: Never Used  . Alcohol use No    Review of Systems Constitutional: No fever/chills Eyes: No visual changes. ENT: No sore throat. No stiff neck no neck pain Cardiovascular: Denies chest pain. Respiratory: Denies shortness of breath. Gastrointestinal:   no vomiting.  No diarrhea.  No constipation. Genitourinary: Negative for dysuria. Musculoskeletal: Negative lower extremity swelling Skin: Negative for rash. Neurological: Negative for severe headaches, focal weakness or numbness.   ____________________________________________   PHYSICAL EXAM:  VITAL SIGNS: ED Triage Vitals  Enc Vitals Group     BP 07/02/16 1400 132/69     Pulse Rate 07/02/16 1400 (!) 54     Resp --      Temp 07/02/16 1624 98.3 F (36.8 C)     Temp Source 07/02/16 1624 Oral     SpO2 07/02/16 1400 99 %     Weight 07/02/16 1400 185 lb (83.9 kg)     Height 07/02/16 1400 5\' 10"  (1.778 m)     Head Circumference --      Peak Flow --      Pain Score 07/02/16 1400 8     Pain Loc --      Pain Edu? --      Excl. in GC? --     Constitutional: Alert and oriented. Well appearing and in no acute distress. Eyes:  Conjunctivae are normal, Funduscopic exam reveals no evidence of papilledema Head: Atraumatic HEENT: No congestion/rhinnorhea. Mucous membranes are moist.  Oropharynx non-erythematous Neck:   Nontender with no meningismus, no masses, no stridor Cardiovascular: Normal rate, regular rhythm. Grossly normal heart sounds.  Good peripheral circulation. Respiratory: Normal respiratory effort.  No retractions. Lungs CTAB. Abdominal: Soft and nontender. No distention. No guarding no rebound Back:  There is no focal tenderness or step off.  there is no midline tenderness there are no  lesions noted. there is no CVA tenderness Musculoskeletal: No lower extremity tenderness, no upper extremity tenderness. No joint effusions, no DVT signs strong distal pulses no edema Neurologic:  Cranial nerves II through XII are grossly intact 5 out of 5 strength bilateral upper and lower extremity. Finger to nose within normal limits heel to shin within normal limits, speech is normal with no word finding difficulty or dysarthria, reflexes symmetric, pupils are equally round and reactive to light, there is no pronator drift, sensation is normal, vision is intact to confrontation, gait is deferred, there is no nystagmus, normal neurologic exam Skin:  Skin is warm, dry and intact. No rash noted. Psychiatric: Mood and affect are Mildly anxious. Speech and behavior are normal.  ____________________________________________   LABS (all labs ordered are listed, but only abnormal results are displayed)  Labs Reviewed  CBC WITH DIFFERENTIAL/PLATELET  BASIC METABOLIC PANEL  URINALYSIS, COMPLETE ([facility]) WITH MICROSCOPIC   ____________________________________________  EKG  I personally interpreted any EKGs ordered by me or triage  ____________________________________________  RADIOLOGY  I reviewed any imaging ordered by me or triage that were performed during my shift and, if possible, patient and/or family made aware of any abnormal findings. ____________________________________________   PROCEDURES  Procedure(s) performed: None  Procedures  Critical Care performed: None  ____________________________________________   INITIAL IMPRESSION / ASSESSMENT AND PLAN / ED COURSE  Pertinent labs & imaging results that were available during my care of the patient were reviewed by me and considered in my medical decision making (see chart for details).  Patient with history of chronic recurrent headaches of exactly this Friday he states for many years prior to his diagnosis of HIV. His HIV  is very well controlled. He is in no acute distress. He has a normal neurologic exam, nothing at this time to see better likelihood of meningitis or fungal infection of the brain. Low suspicion for meningitis or mass, chronic recurrent headaches with no fever, no neurologic findings, I did do a CT scan of the head which is negative. Patient got Compazine here and felt much better but then he became very anxious and demanded that we pull out the IV. He remains neurologically intact. He could be having some degree of reaction to the Compazine although is not clear. He is having difficulty expressing he just states he feels somewhat anxious and he would like to drink some water. He remains very nontoxic in appearance. I'll give him Benadryl for possible reaction to the Compazine and we will continue to observe him. Really no evidence of anaphylaxis or allergic reaction more consistent with questionable dystonic reaction.   ----------------------------------------- 5:47 PM on 07/02/2016 ----------------------------------------- Patient at this time is no complaint of headache remains very calm and alert at this time and feels 100% better.  ----------------------------------------- 8:17 PM on 07/02/2016 -----------------------------------------  Observe for extended period in the emergency department, patient has no headaches, neurologic exam remains completely normal. He is in no acute distress. He is requesting discharge. We did discuss lumbar  puncture for his headache, did explain to him that there are some disease processes which I cannot rule out without LP. He and I discussed risks benefits and alternatives of LP and he declined. He understands the risk of clotting. He is wide awake and alert at this time. I did advise that if he feels any degree of compromise from having had Benadryl several hours ago he should not drive. However at this time clinically there is no evidence of that. Patient is eager to  go home. We will discharge with close outpatient follow-up with neurology for his chronic recurrent headaches as well as his primary care doctor. Return precautions given and understood.  ____________________________________________   FINAL CLINICAL IMPRESSION(S) / ED DIAGNOSES  Final diagnoses:  None      This chart was dictated using voice recognition software.  Despite best efforts to proofread,  errors can occur which can change meaning.      Jeanmarie PlantMcShane, Ranbir Chew A, MD 07/02/16 1728    Jeanmarie PlantMcShane, Lakiah Dhingra A, MD 07/02/16 [id number]    Jeanmarie PlantMcShane, Sheilla Maris A, MD 07/02/16 2018

## 2016-07-02 NOTE — ED Triage Notes (Signed)
Pt to ed with c/o headache x 2 weeks intermittently.  Pt states they are always on the right side of his head.  Pt alert and oriented and states the pain is relieved by taking a nap.

## 2016-07-02 NOTE — ED Notes (Addendum)
After giving patient compazine patient called out that his IV was hurting and his arm was numb, patient states he was getting hot and felt like he needed to stand up and felt anxious. This RN removed his IV. EDP aware and to beside. Verbal order for oral benadryl.

## 2016-07-02 NOTE — ED Notes (Signed)
Patient states his head is much better. Patient is feeling fine.

## 2020-02-10 ENCOUNTER — Encounter: Payer: Self-pay | Admitting: Emergency Medicine

## 2020-02-10 ENCOUNTER — Other Ambulatory Visit: Payer: Self-pay

## 2020-02-10 ENCOUNTER — Emergency Department
Admission: EM | Admit: 2020-02-10 | Discharge: 2020-02-10 | Disposition: A | Discharge status: Home / Self Care | Payer: Self-pay | Attending: Emergency Medicine | Admitting: Emergency Medicine

## 2020-02-10 DIAGNOSIS — L03011 Cellulitis of right finger: Secondary | ICD-10-CM | POA: Insufficient documentation

## 2020-02-10 DIAGNOSIS — Z87891 Personal history of nicotine dependence: Secondary | ICD-10-CM | POA: Insufficient documentation

## 2020-02-10 MED ORDER — SULFAMETHOXAZOLE-TRIMETHOPRIM 800-160 MG PO TABS: 1.0000 | ORAL_TABLET | Freq: Two times a day (BID) | ORAL | 0 refills | Status: AC

## 2020-02-10 NOTE — ED Notes (Signed)
Pt has redness, pain to right thumb around the nailbed.  Pt states he struck it on the dresser 2 days ago.   Pt alert.

## 2020-02-10 NOTE — ED Provider Notes (Signed)
[facility] Emergency Department Provider Note  ____________________________________________  Time seen: Approximately 4:37 PM  I have reviewed the triage vital signs and the nursing notes.   HISTORY  Chief Complaint Finger Injury    HPI Anthony Nunez is a 30 y.o. male who presents the emergency department for evaluation of swelling to the right thumb.  Patient states that he hit his thumb on the corner of a dresser couple of days ago.  He is now having increasing pain and edema around the medial aspect of the nailbed.  No fevers or chills.  No streaking.  Full range of motion to the digit.  No history of recurrent skin infections.         History reviewed. No pertinent past medical history.  There are no problems to display for this patient.   History reviewed. No pertinent surgical history.  Prior to Admission medications   Medication Sig Start Date End Date Taking? Authorizing Provider  sulfamethoxazole-trimethoprim (BACTRIM DS) 800-160 MG tablet Take 1 tablet by mouth 2 (two) times daily. 02/10/20  Yes Cuthriell, Delorise Royals, PA-C  acetaminophen (TYLENOL) 325 MG tablet Take 650 mg by mouth every 6 (six) hours as needed.    [provider]  albuterol (PROVENTIL HFA;VENTOLIN HFA) 108 (90 Base) MCG/ACT inhaler Inhale 2 puffs into the lungs every 6 (six) hours as needed for wheezing or shortness of breath. Patient not taking: Reported on 07/02/2016 02/21/15   Jennye Moccasin, MD  emtricitabine-tenofovir (TRUVADA) 200-300 MG tablet Take 1 tablet by mouth daily.    [provider]    Allergies Patient has no known allergies.  History reviewed. No pertinent family history.  Social History Social History   Tobacco Use  . Smoking status: Former Smoker    Types: Cigarettes  . Smokeless tobacco: Never Used  Substance Use Topics  . Alcohol use: No  . Drug use: Yes    Types: Marijuana    Comment: last use 01/21/15     Review of  Systems  Constitutional: No fever/chills Eyes: No visual changes. No discharge ENT: No upper respiratory complaints. Cardiovascular: no chest pain. Respiratory: no cough. No SOB. Gastrointestinal: No abdominal pain.  No nausea, no vomiting.  No diarrhea.  No constipation. Musculoskeletal: Pain, swelling along the medial aspect of the right thumbnail Skin: Negative for rash, abrasions, lacerations, ecchymosis. Neurological: Negative for headaches, focal weakness or numbness.  10 System ROS otherwise negative.  ____________________________________________   PHYSICAL EXAM:  VITAL SIGNS: ED Triage Vitals  Enc Vitals Group     BP 02/10/20 1524 137/85     Pulse Rate 02/10/20 1524 94     Resp 02/10/20 1524 15     Temp 02/10/20 1524 98.5 F (36.9 C)     Temp Source 02/10/20 1524 Oral     SpO2 02/10/20 1524 97 %     Weight 02/10/20 1519 185 lb (83.9 kg)     Height 02/10/20 1519 5\' 10"  (1.778 m)     Head Circumference --      Peak Flow --      Pain Score 02/10/20 1520 5     Pain Loc --      Pain Edu? --      Excl. in GC? --      Constitutional: Alert and oriented. Well appearing and in no acute distress. Eyes: Conjunctivae are normal. PERRL. EOMI. Head: Atraumatic. ENT:      Ears:       Nose: No congestion/rhinnorhea.  Mouth/Throat: Mucous membranes are moist.  Neck: No stridor.    Cardiovascular: Normal rate, regular rhythm. Normal S1 and S2.  Good peripheral circulation. Respiratory: Normal respiratory effort without tachypnea or retractions. Lungs CTAB. Good air entry to the bases with no decreased or absent breath sounds. Musculoskeletal: Full range of motion to all extremities. No gross deformities appreciated.  Mild erythema, edema with a small purulent collection identified along the medial and superior aspect of the thumbnail.  No active drainage.  Good range of motion to the digit.  No other concerning findings on physical exam.  Sensation capillary refill intact  to the digit. Neurologic:  Normal speech and language. No gross focal neurologic deficits are appreciated.  Skin:  Skin is warm, dry and intact. No rash noted. Psychiatric: Mood and affect are normal. Speech and behavior are normal. Patient exhibits appropriate insight and judgement.   ____________________________________________   LABS (all labs ordered are listed, but only abnormal results are displayed)  Labs Reviewed - No data to display ____________________________________________  EKG   ____________________________________________  RADIOLOGY   No results found.  ____________________________________________    PROCEDURES  Procedure(s) performed:    Marland KitchenMarland KitchenIncision and Drainage  Date/Time: 02/10/2020 5:33 PM Performed by: Racheal Patches, PA-C Authorized by: Racheal Patches, PA-C   Consent:    Consent obtained:  Verbal   Consent given by:  Patient   Risks discussed:  Bleeding, incomplete drainage and pain Universal protocol:    Procedure explained and questions answered to patient or proxy's satisfaction: yes     Patient identity confirmed:  Verbally with patient Location:    Indications for incision and drainage: paronychia.   Location:  Upper extremity   Upper extremity location:  Finger   Finger location:  R thumb Sedation:    Sedation type:  None Anesthesia:    Anesthesia method:  None Procedure type:    Complexity:  Simple Procedure details:    Incision types:  Stab incision   Wound management:  Probed and deloculated   Drainage:  Purulent   Drainage amount:  Scant   Packing materials:  None Post-procedure details:    Procedure completion:  Tolerated well, no immediate complications Comments:     Paronychia to the right thumb was drained using a 18-gauge needle.      Medications - No data to display   ____________________________________________   INITIAL IMPRESSION / ASSESSMENT AND PLAN / ED COURSE  Pertinent labs &  imaging results that were available during my care of the patient were reviewed by me and considered in my medical decision making (see chart for details).  Review of the [place] CSRS was performed in accordance of the [facility] prior to dispensing any controlled drugs.           Patient's diagnosis is consistent with paronychia.  Patient presented to the emergency department complaining of pain, swelling along the nailbed of the right thumb.  Patient states that he had an injury a couple of days ago but then had some erythema and edema to the area.  Findings are consistent with paronychia.  This was drained at bedside.  Patient was placed on antibiotics.  Follow-up with primary care as needed.. Patient is given ED precautions to return to the ED for any worsening or new symptoms.     ____________________________________________  FINAL CLINICAL IMPRESSION(S) / ED DIAGNOSES  Final diagnoses:  Paronychia of right thumb      NEW MEDICATIONS STARTED DURING THIS VISIT:  ED Discharge Orders  Ordered    sulfamethoxazole-trimethoprim (BACTRIM DS) 800-160 MG tablet  2 times daily        02/10/20 1735              This chart was dictated using voice recognition software/Dragon. Despite best efforts to proofread, errors can occur which can change the meaning. Any change was purely unintentional.    Racheal Patches, PA-C 02/10/20 1735    Delton Prairie, MD 02/10/20 1910

## 2020-02-10 NOTE — ED Triage Notes (Signed)
Pt comes into the ED via POV c/o right thumb injury after hitting the nailbed on the corner of a dresser. Pt has swelling noted around the cuticle but is in NAD.

## 2020-09-15 ENCOUNTER — Other Ambulatory Visit: Payer: Self-pay | Admitting: Family Medicine

## 2020-09-15 NOTE — Progress Notes (Unsigned)
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    Wendi Snipes, FNP

## 2020-10-20 ENCOUNTER — Emergency Department
Admission: EM | Admit: 2020-10-20 | Discharge: 2020-10-20 | Disposition: A | Discharge status: Home / Self Care | Payer: Self-pay | Attending: Emergency Medicine | Admitting: Emergency Medicine

## 2020-10-20 ENCOUNTER — Other Ambulatory Visit: Payer: Self-pay

## 2020-10-20 ENCOUNTER — Emergency Department: Payer: Self-pay

## 2020-10-20 DIAGNOSIS — Z21 Asymptomatic human immunodeficiency virus [HIV] infection status: Secondary | ICD-10-CM | POA: Insufficient documentation

## 2020-10-20 DIAGNOSIS — Z87891 Personal history of nicotine dependence: Secondary | ICD-10-CM | POA: Insufficient documentation

## 2020-10-20 DIAGNOSIS — R42 Dizziness and giddiness: Secondary | ICD-10-CM | POA: Insufficient documentation

## 2020-10-20 DIAGNOSIS — R569 Unspecified convulsions: Secondary | ICD-10-CM | POA: Insufficient documentation

## 2020-10-20 HISTORY — DX: Immunodeficiency, unspecified: D84.9

## 2020-10-20 HISTORY — DX: Asymptomatic human immunodeficiency virus (hiv) infection status: Z21

## 2020-10-20 HISTORY — DX: Human immunodeficiency virus (HIV) disease: B20

## 2020-10-20 LAB — CBC WITH DIFFERENTIAL/PLATELET
Abs Immature Granulocytes: 0.02 10*3/uL (ref 0.00–0.07)
Basophils Absolute: 0 10*3/uL (ref 0.0–0.1)
Basophils Relative: 0 %
Eosinophils Absolute: 0.3 10*3/uL (ref 0.0–0.5)
Eosinophils Relative: 5 %
HCT: 35.3 % — ABNORMAL LOW (ref 39.0–52.0)
Hemoglobin: 12.2 g/dL — ABNORMAL LOW (ref 13.0–17.0)
Immature Granulocytes: 0 %
Lymphocytes Relative: 53 %
Lymphs Abs: 3.2 10*3/uL (ref 0.7–4.0)
MCH: 32.3 pg (ref 26.0–34.0)
MCHC: 34.6 g/dL (ref 30.0–36.0)
MCV: 93.4 fL (ref 80.0–100.0)
Monocytes Absolute: 0.4 10*3/uL (ref 0.1–1.0)
Monocytes Relative: 7 %
Neutro Abs: 2.1 10*3/uL (ref 1.7–7.7)
Neutrophils Relative %: 35 %
Platelets: 246 10*3/uL (ref 150–400)
RBC: 3.78 MIL/uL — ABNORMAL LOW (ref 4.22–5.81)
RDW: 14.7 % (ref 11.5–15.5)
WBC: 6 10*3/uL (ref 4.0–10.5)
nRBC: 0 % (ref 0.0–0.2)

## 2020-10-20 LAB — COMPREHENSIVE METABOLIC PANEL
ALT: 11 U/L (ref 0–44)
AST: 22 U/L (ref 15–41)
Albumin: 3.5 g/dL (ref 3.5–5.0)
Alkaline Phosphatase: 72 U/L (ref 38–126)
Anion gap: 9 (ref 5–15)
BUN: 5 mg/dL — ABNORMAL LOW (ref 6–20)
CO2: 25 mmol/L (ref 22–32)
Calcium: 8.5 mg/dL — ABNORMAL LOW (ref 8.9–10.3)
Chloride: 103 mmol/L (ref 98–111)
Creatinine, Ser: 0.84 mg/dL (ref 0.61–1.24)
GFR, Estimated: >60 mL/min (ref >60)
Glucose, Bld: 113 mg/dL — ABNORMAL HIGH (ref 70–99)
Potassium: 3.6 mmol/L (ref 3.5–5.1)
Sodium: 137 mmol/L (ref 135–145)
Total Bilirubin: 0.4 mg/dL (ref 0.3–1.2)
Total Protein: 6.8 g/dL (ref 6.5–8.1)

## 2020-10-20 NOTE — Discharge Instructions (Signed)
As we discussed please do not drive, go up on the ladders, going any pools or yourself in a situation that might be dangerous to yourself or others if you were to have another seizure until you can follow-up and get cleared by neurology. Please seek medical attention for any high fevers, chest pain, shortness of breath, change in behavior, persistent vomiting, bloody stool or any other new or concerning symptoms.

## 2020-10-20 NOTE — ED Notes (Signed)
Pt. Returned from CT.

## 2020-10-20 NOTE — ED Provider Notes (Signed)
[facility] Emergency Department Provider Note  ____________________________________________   I have reviewed the triage vital signs and the nursing notes.   HISTORY  Chief Complaint Seizures   History limited by: Not Limited   HPI Anthony Nunez is a 30 y.o. male who presents to the emergency department today from work after possible seizure-like episode.  The patient states that he had felt a little bit dizzy while at work earlier today.  He also had some fatigue.  He then does not remember what happened at work although report is that coworkers witnessed him having roughly 5 to 6 minutes of tonic-clonic activity.  Apparently he was postictal for EMS.  Patient does think that he might have bit his tongue.  He denies any headache at the time my exam.  He denies any history of seizures.  Denies any recent alcohol use.  Records reviewed. Per medical record review patient has a history of HIV  Past Medical History:  Diagnosis Date   HIV (human immunodeficiency virus infection) (HCC)    Immune deficiency disorder (HCC)     There are no problems to display for this patient.   No past surgical history on file.  Prior to Admission medications   Medication Sig Start Date End Date Taking? Authorizing Provider  acetaminophen (TYLENOL) 325 MG tablet Take 650 mg by mouth every 6 (six) hours as needed.    [provider]  albuterol (PROVENTIL HFA;VENTOLIN HFA) 108 (90 Base) MCG/ACT inhaler Inhale 2 puffs into the lungs every 6 (six) hours as needed for wheezing or shortness of breath. Patient not taking: Reported on 07/02/2016 02/21/15   Jennye Moccasin, MD  emtricitabine-tenofovir (TRUVADA) 200-300 MG tablet Take 1 tablet by mouth daily.    [provider]  sulfamethoxazole-trimethoprim (BACTRIM DS) 800-160 MG tablet Take 1 tablet by mouth 2 (two) times daily. 02/10/20   Cuthriell, Delorise Royals, PA-C    Allergies Patient has no known  allergies.  No family history on file.  Social History Social History   Tobacco Use   Smoking status: Former    Types: Cigarettes   Smokeless tobacco: Never  Substance Use Topics   Alcohol use: No   Drug use: Yes    Types: Marijuana    Review of Systems Constitutional: No fever/chills Eyes: No visual changes. ENT: Positive for sore tongue. Cardiovascular: Denies chest pain. Respiratory: Denies shortness of breath. Gastrointestinal: No abdominal pain.  No nausea, no vomiting.  No diarrhea.   Genitourinary: Negative for dysuria. Musculoskeletal: Negative for back pain. Skin: Negative for rash. Neurological: Positive for lightheadedness.  ____________________________________________   PHYSICAL EXAM:  VITAL SIGNS: ED Triage Vitals [10/20/20 1644]  Enc Vitals Group     BP      Pulse      Resp      Temp      Temp src      SpO2      Weight      Height      Head Circumference      Peak Flow      Pain Score 7     Pain Loc      Pain Edu?      Excl. in GC?      Constitutional: Alert and oriented.  Eyes: Conjunctivae are normal.  ENT      Head: Normocephalic and atraumatic.      Nose: No congestion/rhinnorhea.      Mouth/Throat: Mucous membranes are moist. Tongue with small  amount of maceration to left side.      Neck: No stridor. Hematological/Lymphatic/Immunilogical: No cervical lymphadenopathy. Cardiovascular: Normal rate, regular rhythm.  No murmurs, rubs, or gallops.  Respiratory: Normal respiratory effort without tachypnea nor retractions. Breath sounds are clear and equal bilaterally. No wheezes/rales/rhonchi. Gastrointestinal: Soft and non tender. No rebound. No guarding.  Genitourinary: Deferred Musculoskeletal: Normal range of motion in all extremities. No lower extremity edema. Neurologic:  Normal speech and language. No gross focal neurologic deficits are appreciated.  Skin:  Skin is warm, dry and intact. No rash noted. Psychiatric: Mood and affect  are normal. Speech and behavior are normal. Patient exhibits appropriate insight and judgment.  ____________________________________________    LABS (pertinent positives/negatives)  CMP wnl except glu 113, BUN 5, ca 8.5 CBC wbc 6.0, hgb 12.2, plt 246  ____________________________________________   EKG  I, Phineas Semen, attending physician, personally viewed and interpreted this EKG  EKG Time: 1654 Rate: 80 Rhythm: sinus rhythm Axis: normal Intervals: qtc 401 QRS: narrow ST changes: no st elevation Impression: normal ekg  ____________________________________________    RADIOLOGY  CT head No acute abnormality  MR brain No acute abnormality  ____________________________________________   PROCEDURES  Procedures  ____________________________________________   INITIAL IMPRESSION / ASSESSMENT AND PLAN / ED COURSE  Pertinent labs & imaging results that were available during my care of the patient were reviewed by me and considered in my medical decision making (see chart for details).   Patient presented to the emergency department today because of concern for seizure like activity.  The patient does not have any history of seizures.  It does appear that he bit his tongue.  Work-up here without concerning findings.  No electrolyte abnormality that would explain seizure.  Did get both a head CT and MRI without any concerning abnormalities.  This point somewhat unclear etiology of the patient's seizure.  I did discuss with patient importance of close neurology follow-up.  Also discussed with patient not driving or doing anything that could be dangerous to himself or others if he was to have another seizure.  ___________________________________________   FINAL CLINICAL IMPRESSION(S) / ED DIAGNOSES  Final diagnoses:  Seizure-like activity (HCC)     Note: This dictation was prepared with Dragon dictation. Any transcriptional errors that result from this process  are unintentional     Phineas Semen, MD 10/20/20 [phone]

## 2020-10-20 NOTE — ED Triage Notes (Signed)
Pt comes via EMs from work with c/o seizure witnessed by coworkers. Per coworkers they stated tonic clonic seizure that last 5-8 minutes.   EMS reports pt was postictal upon arrival and unable to answer questions.  CBG-103 VSS  Pt has hx of HIV and monkey poxs only. No seizure hx.  Pt has 20g in lac. Pt is A*OX4.

## 2022-07-17 IMAGING — MR MR HEAD W/O CM
12 series · 48 of 48 positions shown · non-contrast
Comparison: None.

CLINICAL DATA: Seizure

EXAM:
MRI HEAD WITHOUT CONTRAST
TECHNIQUE: Multiplanar, multiecho pulse sequences of the brain and surrounding
structures were obtained without intravenous contrast.

[Series 5: ax dwi_tracew · axial · 3.0mm · 0.65mm/px · z∈[-92,+59]mm · 3 of 48 slices shown]
[im 1/48]
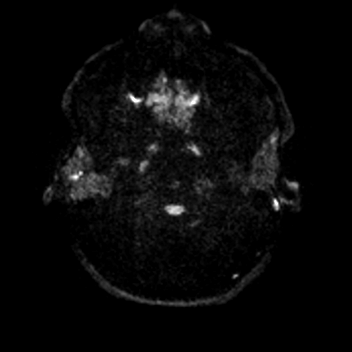
[im 24/48]
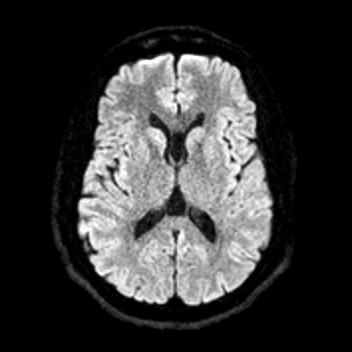
[im 48/48]
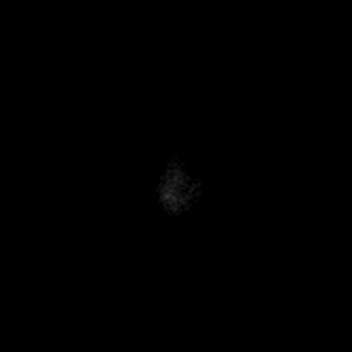

[Series 6: ax dwi_adc · axial · 3.0mm · 0.65mm/px · z∈[-92,+59]mm · 3 of 48 slices shown]
[im 1/48]
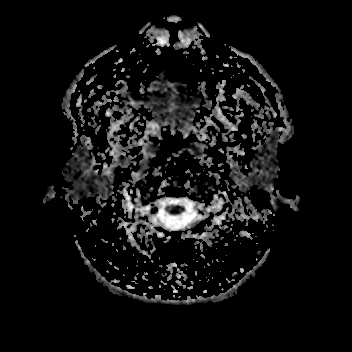
[im 24/48]
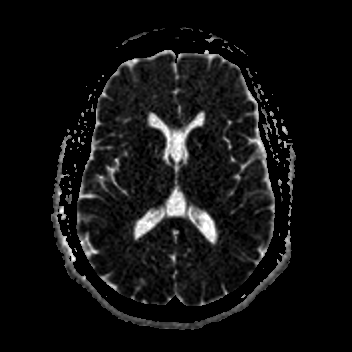
[im 48/48]
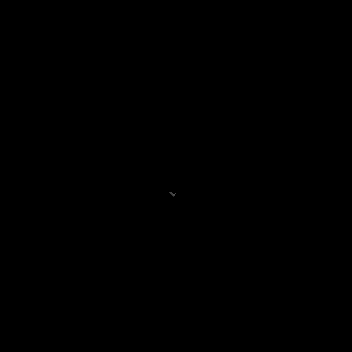

[Series 7: cor dwi_tracew · coronal · 5.0mm · 1.31mm/px · 3 of 40 slices shown]
[im 1/40]
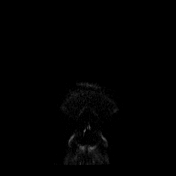
[im 20/40]
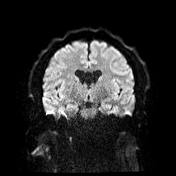
[im 40/40]
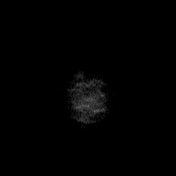

[Series 8: cor dwi_adc · coronal · 5.0mm · 1.31mm/px · 3 of 40 slices shown]
[im 1/40]
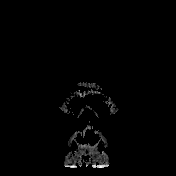
[im 20/40]
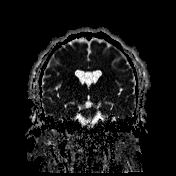
[im 40/40]
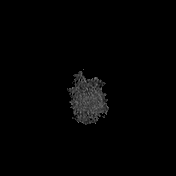

[Series 9: T1 · sagittal · 5.0mm · 0.62mm/px · 2 of 25 slices shown (1 of 2)]
[im 1/25]
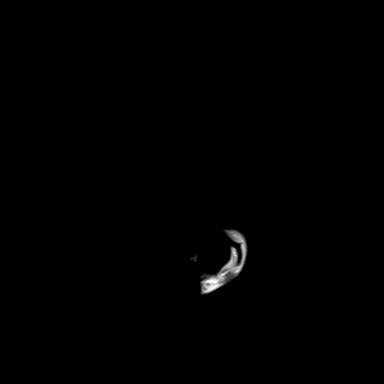
[im 25/25]
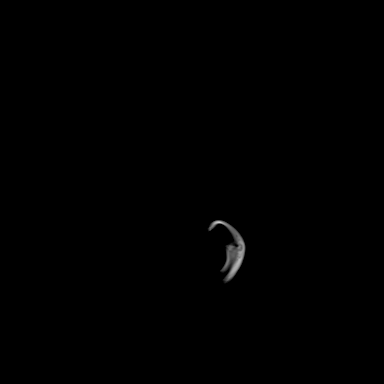

[Series 10: T2 · axial · 3.0mm · 0.45mm/px · z∈[-92,+60]mm · 3 of 48 slices shown (1 of 2)]
[im 1/48]
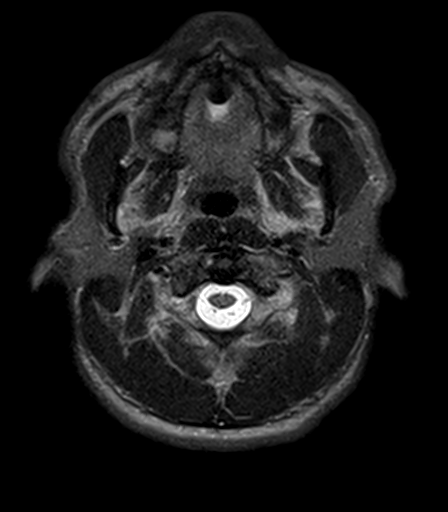
[im 24/48]
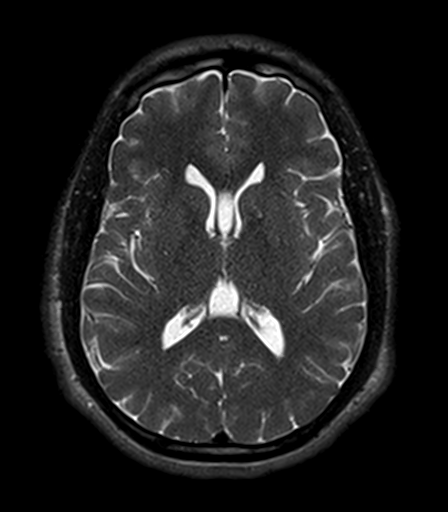
[im 48/48]
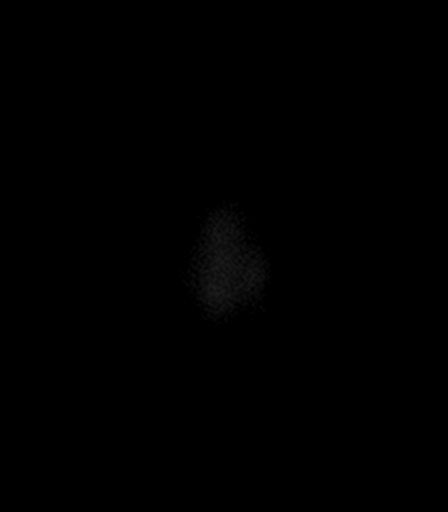

[Series 12: pha_images · axial · 3.0mm · 0.90mm/px · z∈[-103,+71]mm · 4 of 59 slices shown]
[im 1/59]
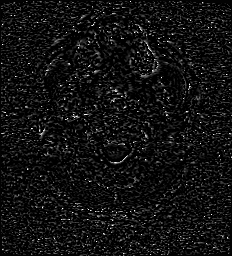
[im 20/59]
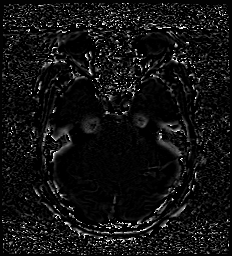
[im 39/59]
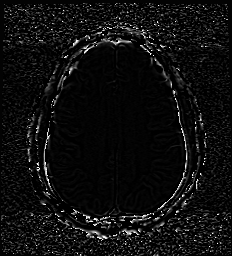
[im 59/59]
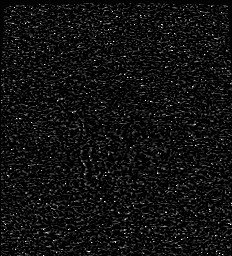

[Series 13: swi_images · axial · 3.0mm · 0.90mm/px · z∈[-103,+71]mm · 4 of 60 slices shown]
[im 1/60]
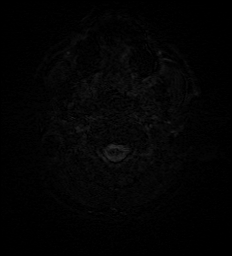
[im 20/60]
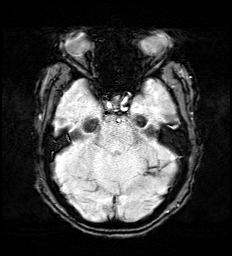
[im 40/60]
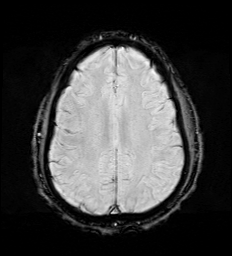
[im 60/60]
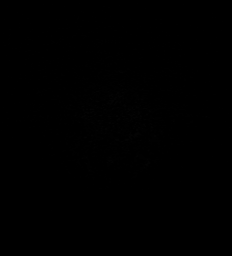

[Series 15: FLAIR · axial · 3.0mm · 0.53mm/px · z∈[-94,+64]mm · 4 of 55 slices shown (1 of 2)]
[im 1/55]
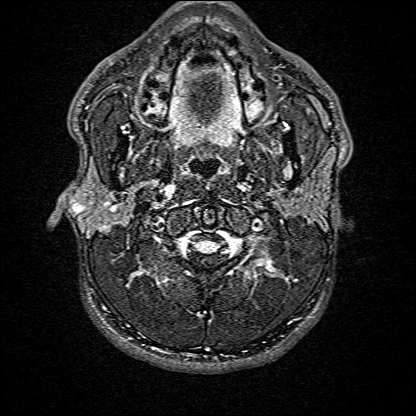
[im 19/55]
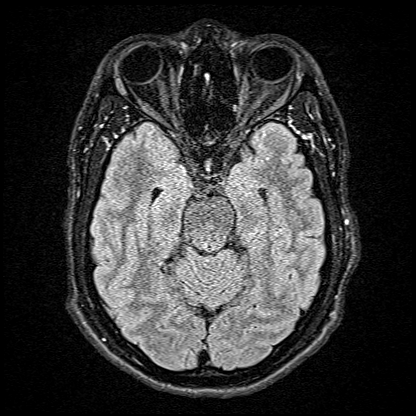
[im 37/55]
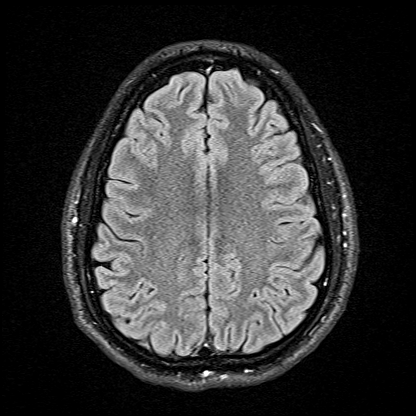
[im 55/55]
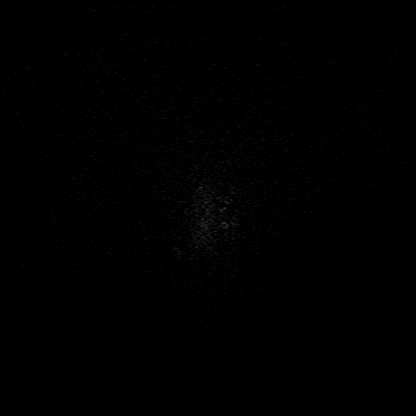

[Series 16: T1 · axial · 1.0mm · 0.98mm/px · z∈[-104,+67]mm · 13 of 176 slices shown (2 of 2)]
[im 1/176]
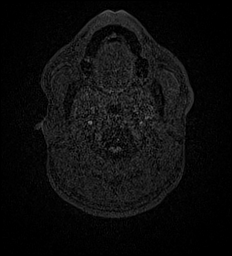
[im 15/176]
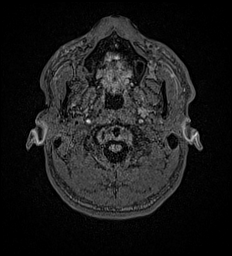
[im 30/176]
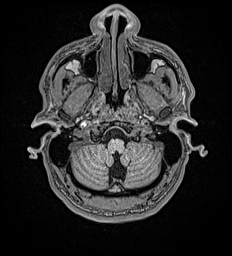
[im 44/176]
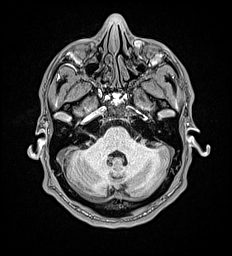
[im 59/176]
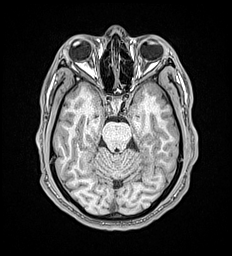
[im 73/176]
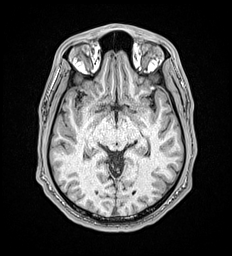
[im 88/176]
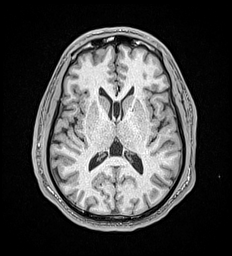
[im 103/176]
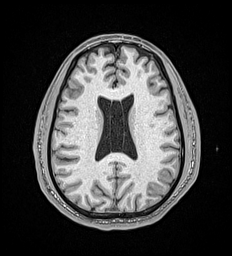
[im 117/176]
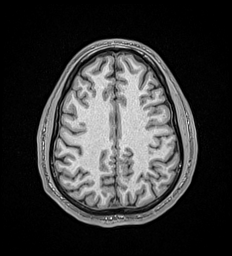
[im 132/176]
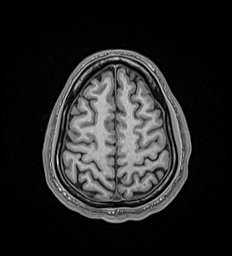
[im 146/176]
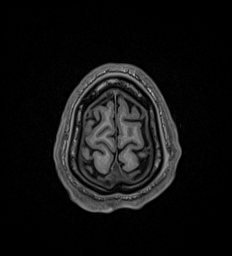
[im 161/176]
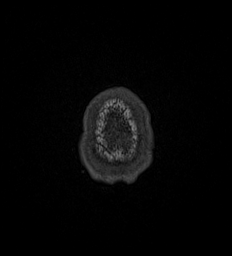
[im 176/176]
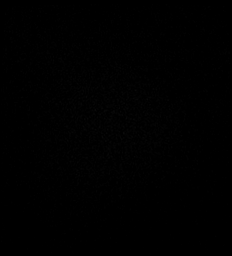

[Series 17: T2 · coronal · 3.0mm · 0.47mm/px · 3 of 35 slices shown (2 of 2)]
[im 1/35]
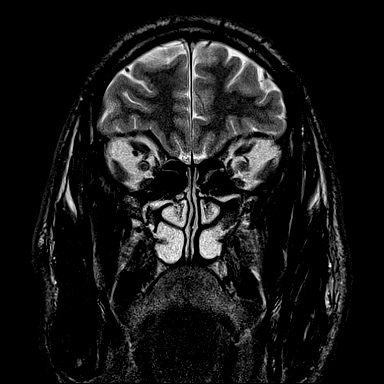
[im 18/35]
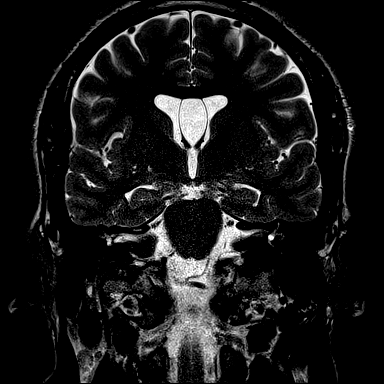
[im 35/35]
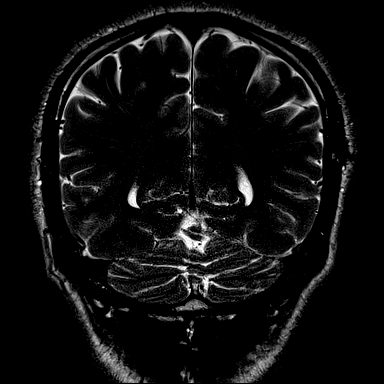

[Series 18: FLAIR · coronal · 3.0mm · 0.47mm/px · 3 of 35 slices shown (2 of 2)]
[im 1/35]
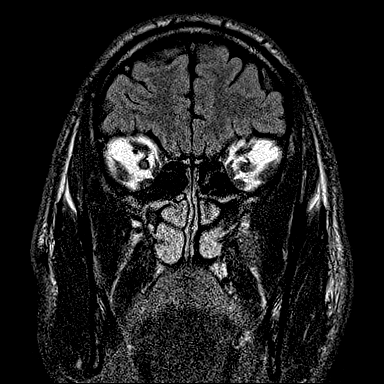
[im 18/35]
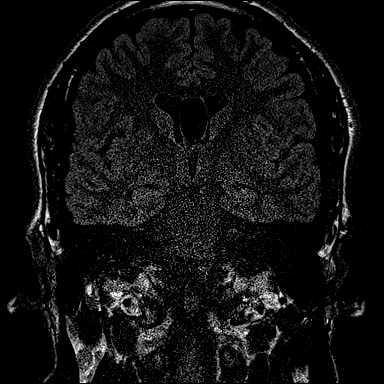
[im 35/35]
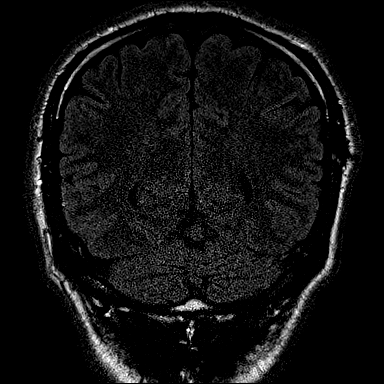

[48 of 48 positions shown; findings below may reference images not displayed]

FINDINGS: Brain: There is no acute infarction or intracranial hemorrhage.
There is no intracranial mass, mass effect, or edema. There is no
hydrocephalus or extra-axial fluid collection. Ventricles and sulci
are normal in size and configuration. Hippocampi are symmetric and
unremarkable.

Vascular: Major vessel flow voids at the skull base are preserved.

Skull and upper cervical spine: Normal marrow signal is preserved.

Sinuses/Orbits: Paranasal sinuses are aerated. Orbits are
unremarkable.

Other: Sella is unremarkable.  Mastoid air cells are clear.
IMPRESSION: No acute or significant abnormality.

## 2022-07-17 IMAGING — CT CT HEAD W/O CM
4 series · 17 of 47 positions shown, 19 images · non-contrast
Comparison: July 02, 2016.

CLINICAL DATA: Seizure.

EXAM:
CT HEAD WITHOUT CONTRAST
TECHNIQUE: Contiguous axial images were obtained from the base of the skull
through the vertex without intravenous contrast.

[Series 2: head bone · axial · 0.41mm/px · z∈[-117,-65]mm · 4 of 75 slices shown]
[im 8/75  bone]
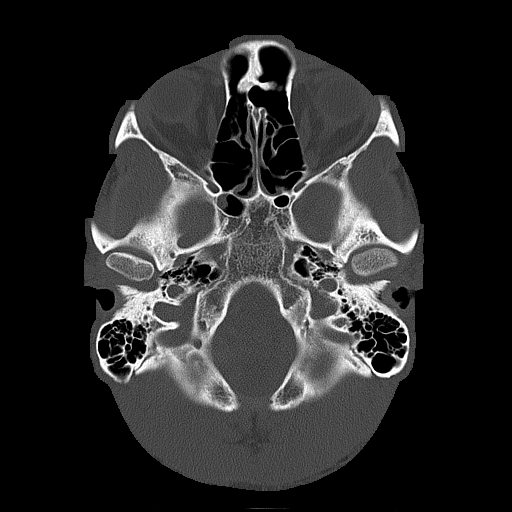
[im 15/75  bone]
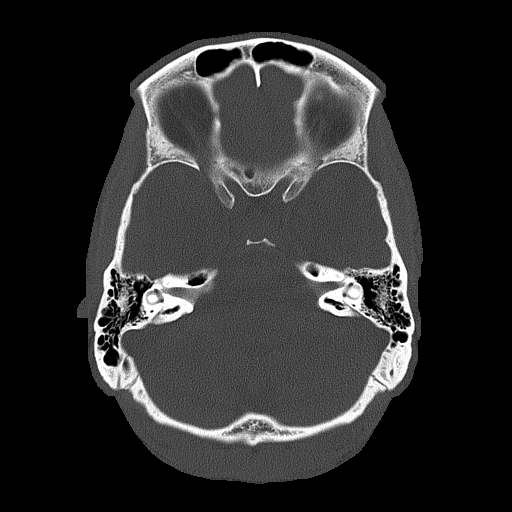
[im 23/75  bone]
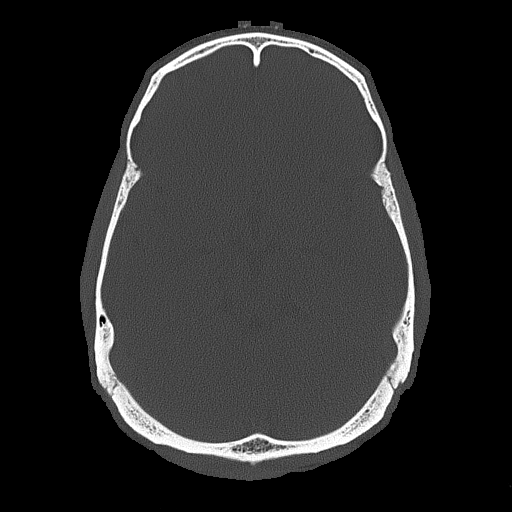
[im 34/75  bone]
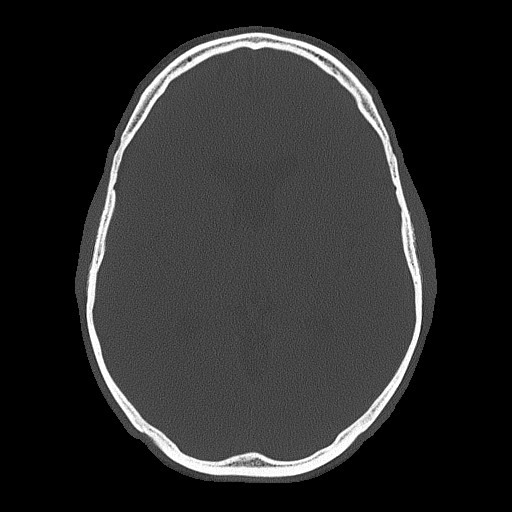

[Series 3: coronal soft tissue · coronal · 0.33mm/px · 3 of 67 slices shown]
[im 23/67  brain]
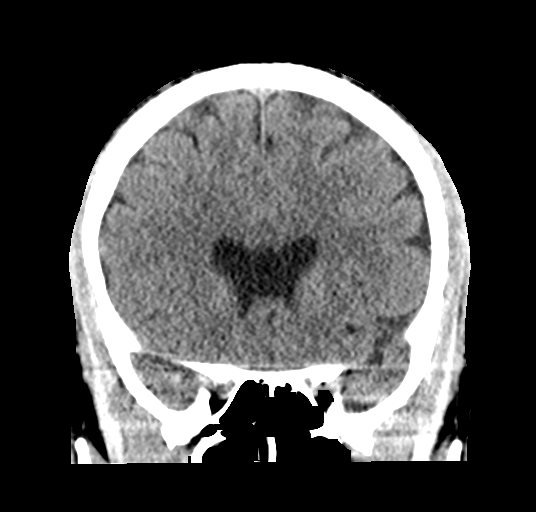
[im 30/67  brain]
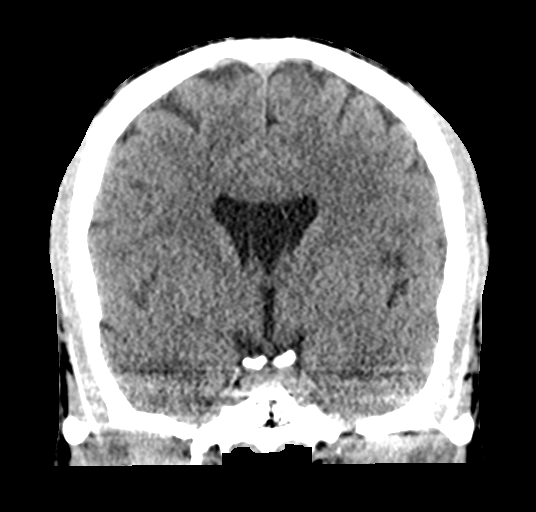
[im 37/67  brain]
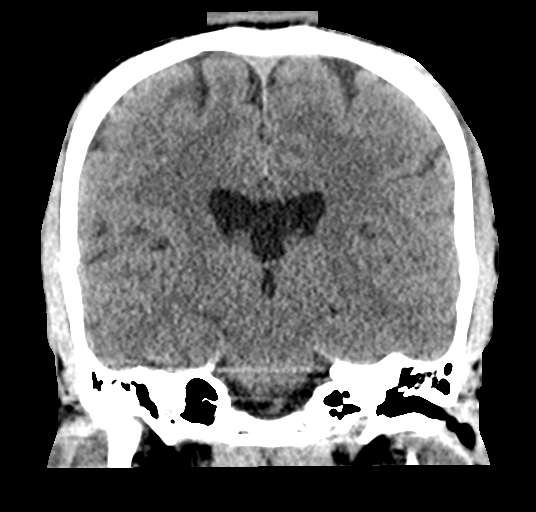

[Series 4: sagittal soft tissue · sagittal · 0.33mm/px · 3 of 60 slices shown]
[im 20/60  brain]
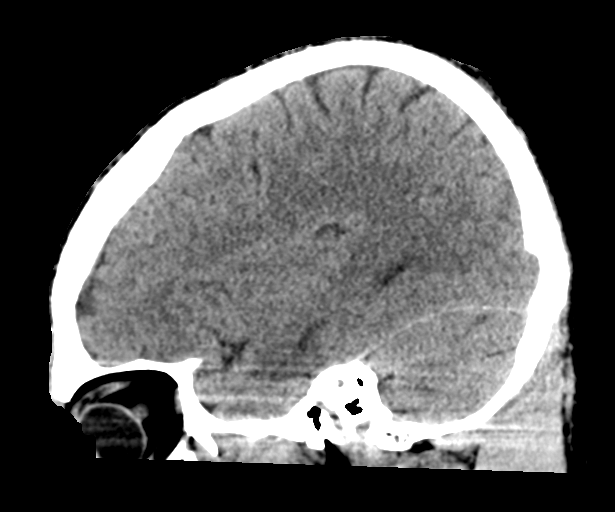
[im 30/60  brain]
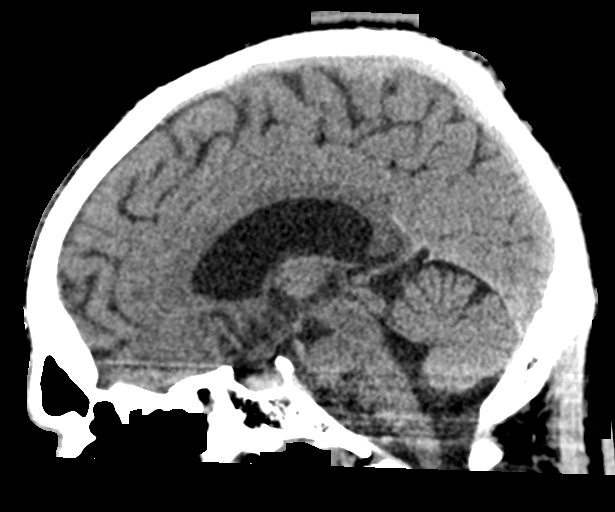
[im 40/60  brain]
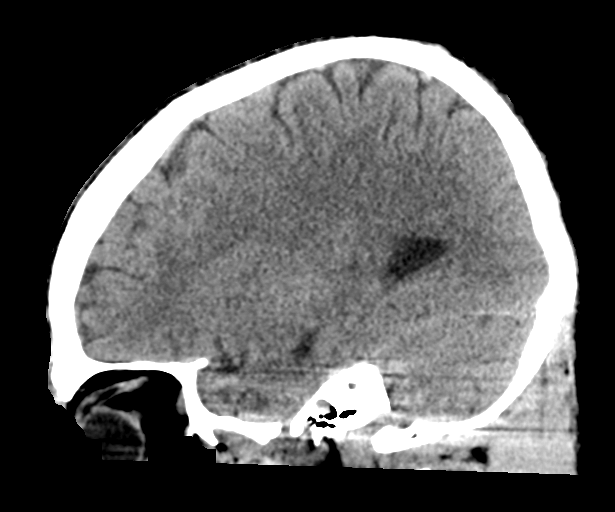

[Series 5: head wo · axial · 0.41mm/px · z∈[-116,-6]mm · 7 of 30 slices shown, 9 images]
[im 4/30  brain]
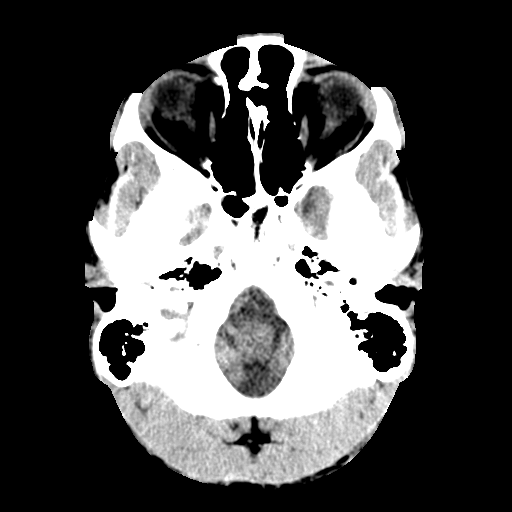
[im 4/30  bone]
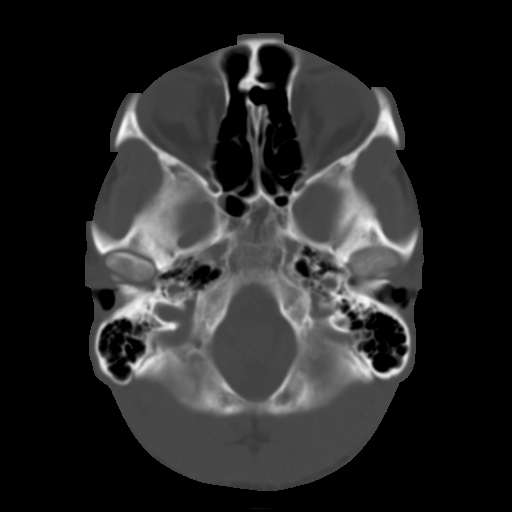
[im 8/30  brain]
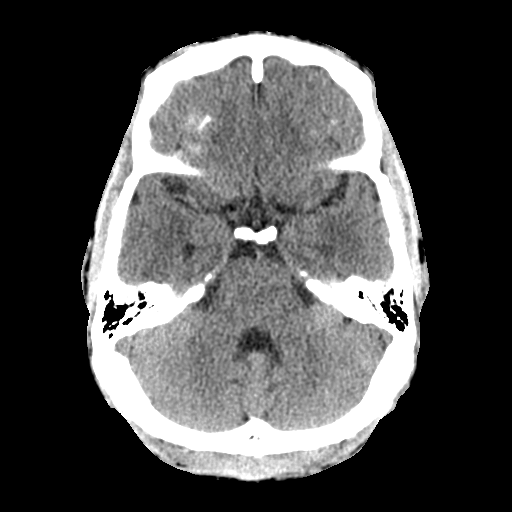
[im 11/30  brain]
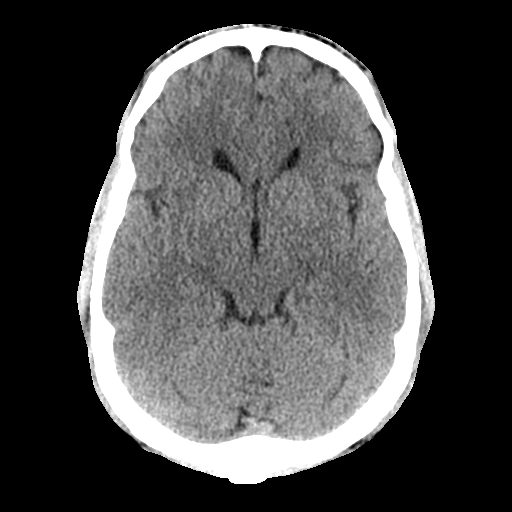
[im 15/30  brain]
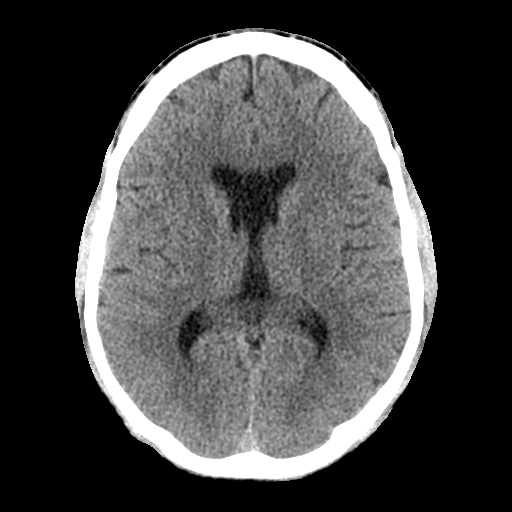
[im 19/30  brain]
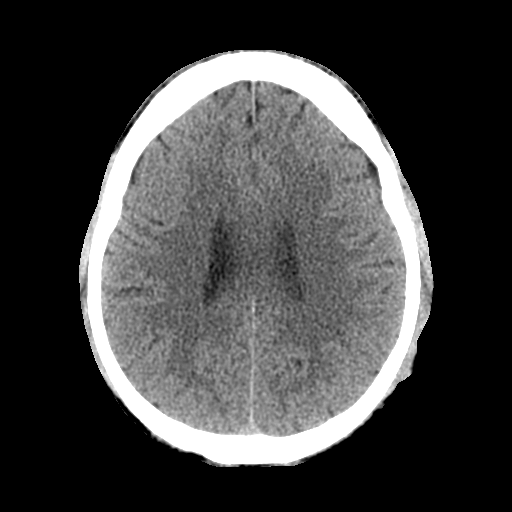
[im 19/30  bone]
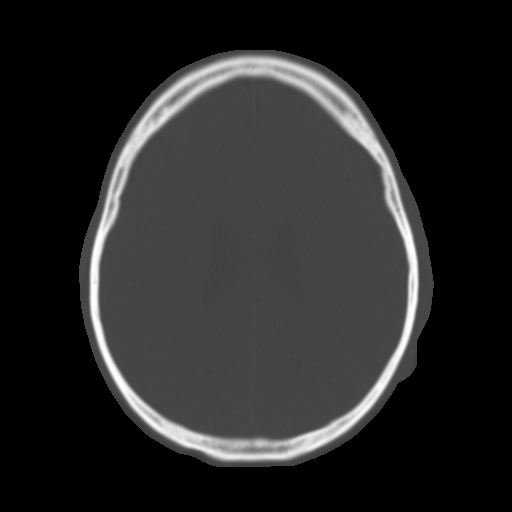
[im 22/30  brain]
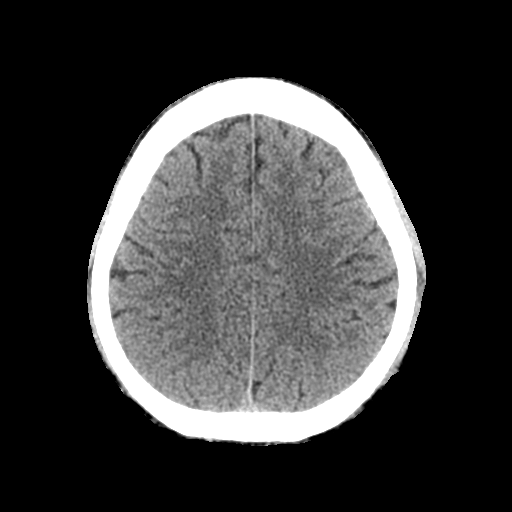
[im 26/30  brain]
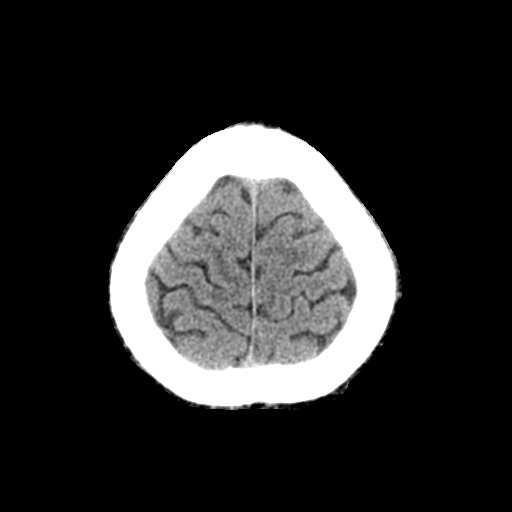

[17 of 47 positions shown; findings below may reference images not displayed]

FINDINGS: Brain: No evidence of acute infarction, hemorrhage, hydrocephalus,
extra-axial collection or mass lesion/mass effect.

Vascular: No hyperdense vessel or unexpected calcification.

Skull: Normal. Negative for fracture or focal lesion.

Sinuses/Orbits: No acute finding.

Other: None.
IMPRESSION: No acute intracranial abnormality seen.

## 2022-07-26 ENCOUNTER — Emergency Department: Payer: Medicaid Other

## 2022-07-26 ENCOUNTER — Other Ambulatory Visit: Payer: Self-pay

## 2022-07-26 ENCOUNTER — Emergency Department
Admission: EM | Admit: 2022-07-26 | Discharge: 2022-07-26 | Disposition: A | Discharge status: Home / Self Care | Payer: Medicaid Other | Attending: Emergency Medicine | Admitting: Emergency Medicine

## 2022-07-26 ENCOUNTER — Encounter: Payer: Self-pay | Admitting: Emergency Medicine

## 2022-07-26 DIAGNOSIS — W502XXA Accidental twist by another person, initial encounter: Secondary | ICD-10-CM | POA: Diagnosis not present

## 2022-07-26 DIAGNOSIS — S82871A Displaced pilon fracture of right tibia, initial encounter for closed fracture: Secondary | ICD-10-CM | POA: Insufficient documentation

## 2022-07-26 DIAGNOSIS — S82873A Displaced pilon fracture of unspecified tibia, initial encounter for closed fracture: Secondary | ICD-10-CM

## 2022-07-26 DIAGNOSIS — S8991XA Unspecified injury of right lower leg, initial encounter: Secondary | ICD-10-CM | POA: Diagnosis present

## 2022-07-26 NOTE — ED Triage Notes (Signed)
Patient [place] ED [place] for right ankle pain. States he twisted ankle x2 this AM. No deformities noted by EMS.

## 2022-07-26 NOTE — ED Provider Notes (Signed)
[facility] Provider Note    None    (approximate)   History   Ankle Pain   HPI  Anthony Nunez is a 32 y.o. male who presents today for evaluation of right ankle pain.  Patient reports that he had went to a friend's house this morning but there was a "miscommunication" and a dog came to the door so he ran away from the dog and twisted his ankle while he was running away.  He reports he did not fall to the ground.  He reports that he later got to a store and slipped and twisted his ankle again.  He did not strike his head or lose consciousness.  He has still been able to ambulate.  He reports that he has pain over the lateral aspect of his ankle.  He has not had any paresthesias or weakness.  No pain elsewhere.  No previous injuries.  There are no problems to display for this patient.         Physical Exam   Triage Vital Signs: ED Triage Vitals  Encounter Vitals Group     BP 07/26/22 0708 131/88     Systolic BP Percentile --      Diastolic BP Percentile --      Pulse Rate 07/26/22 0708 68     Resp 07/26/22 0708 14     Temp 07/26/22 0708 98.4 F (36.9 C)     Temp Source 07/26/22 0708 Oral     SpO2 07/26/22 0708 98 %     Weight 07/26/22 0708 191 lb 9.3 oz (86.9 kg)     Height 07/26/22 0708 5\' 10"  (1.778 m)     Head Circumference --      Peak Flow --      Pain Score 07/26/22 0708 9     Pain Loc --      Pain Education --      Exclude from Growth Chart --     Most recent vital signs: Vitals:   07/26/22 0708  BP: 131/88  Pulse: 68  Resp: 14  Temp: 98.4 F (36.9 C)  SpO2: 98%    Physical Exam Vitals and nursing note reviewed.  Constitutional:      General: Awake and alert. No acute distress.    Appearance: Normal appearance. The patient is overweight.  HENT:     Head: Normocephalic and atraumatic.     Mouth: Mucous membranes are moist.  Eyes:     General: PERRL. Normal EOMs        Right eye: No discharge.        Left eye: No  discharge.     Conjunctiva/sclera: Conjunctivae normal.  Cardiovascular:     Rate and Rhythm: Normal rate and regular rhythm.     Pulses: Normal pulses.  Pulmonary:     Effort: Pulmonary effort is normal. No respiratory distress.     Breath sounds: Normal breath sounds.  Abdominal:     Abdomen is soft. There is no abdominal tenderness. No rebound or guarding. No distention. Musculoskeletal:        General: No swelling. Normal range of motion.     Cervical back: Normal range of motion and neck supple.  Right ankle: Tenderness and very minimal swelling over the anterior ankle, no lateral or medial malleolar tenderness or proximal fifth metacarpal tenderness. No proximal fibular tenderness. 2+ pedal pulses with brisk capillary refill. Intact distal sensation and strength with normal ROM. Able to plantar flex  and dorsiflex against resistance. Able to invert and evert against resistance. Negative  dorsiflexion external rotation test. Negative squeeze test. Negative Thompson test.  No open wounds.  Sensation intact light touch throughout. Skin:    General: Skin is warm and dry.     Capillary Refill: Capillary refill takes less than 2 seconds.     Findings: No rash.  Neurological:     Mental Status: The patient is awake and alert.      ED Results / Procedures / Treatments   Labs (all labs ordered are listed, but only abnormal results are displayed) Labs Reviewed - No data to display   EKG     RADIOLOGY I independently reviewed and interpreted imaging and agree with radiologists findings.     PROCEDURES:  Critical Care performed:   Procedures   MEDICATIONS ORDERED IN ED: Medications - No data to display   IMPRESSION / MDM / ASSESSMENT AND PLAN / ED COURSE  I reviewed the triage vital signs and the nursing notes.   Differential diagnosis includes, but is not limited to, fracture, dislocation, sprain.  Patient is awake and alert, hemodynamically stable and  neurovascularly intact.  He has brisk pedal pulses and normal capillary refill.  He is no swelling or ecchymosis noted.  He has mild tenderness to palpation over his anterior ankle.  Negative Thompson test, do not suspect Achilles tendon rupture.  No proximal fibular tenderness to suggest proximal fibular injury such as Maisonneuve fracture.  There is no tenderness to his foot, specifically to over the fifth metatarsal, I do not suspect Jones fracture.  X-ray of his ankle obtained reveals an avulsion fracture of the tibial plafond fracture.  I discussed this with orthopedics who reviewed the images and feel this is appropriate for a cam boot, weightbearing as tolerated, and follow-up in orthopedic clinic.  Patient is in agreement with this plan.  We also discussed rest, ice, elevation.  He understands return precautions.  He was given the appropriate information to schedule a follow-up appointment with orthopedics.  He was discharged in stable condition.  All questions were answered.   Patient's presentation is most consistent with acute complicated illness / injury requiring diagnostic workup.   Clinical Course as of 07/26/22 0836  Thu Jul 26, 2022  0804 Ortho paged [JP]  0807 Will review images and call back [JP]  0816 Cam boot and weightbearing as tolerated with orthopedic follow-up [JP]    Clinical Course User Index [JP] Antoria Lanza, Herb Grays, PA-C     FINAL CLINICAL IMPRESSION(S) / ED DIAGNOSES   Final diagnoses:  Closed fracture of tibial plafond     Rx / DC Orders   ED Discharge Orders     None        Note:  This document was prepared using Dragon voice recognition software and may include unintentional dictation errors.   Keturah Shavers 07/26/22 1610    Jene Every, MD 07/26/22 1025

## 2022-07-26 NOTE — ED Notes (Signed)
See triage note  Presents he twisted right ankle twice this am  No deformity noted  Good pulses

## 2022-07-26 NOTE — Discharge Instructions (Signed)
Please wear the cam boot at all times, you may bear weight as tolerated.  Please follow-up with orthopedics.  Rest, ice, elevate your ankle.  Please return for any new, worsening, or change in symptoms or other concerns.

## 2022-11-24 ENCOUNTER — Other Ambulatory Visit: Payer: Self-pay

## 2022-11-24 ENCOUNTER — Encounter: Payer: Self-pay | Admitting: Emergency Medicine

## 2022-11-24 ENCOUNTER — Emergency Department
Admission: EM | Admit: 2022-11-24 | Discharge: 2022-11-24 | Disposition: A | Discharge status: Home / Self Care | Payer: Medicaid Other | Attending: Student in an Organized Health Care Education/Training Program | Admitting: Student in an Organized Health Care Education/Training Program

## 2022-11-24 DIAGNOSIS — K029 Dental caries, unspecified: Secondary | ICD-10-CM | POA: Insufficient documentation

## 2022-11-24 DIAGNOSIS — K0889 Other specified disorders of teeth and supporting structures: Secondary | ICD-10-CM | POA: Diagnosis present

## 2022-11-24 MED ORDER — CHLORHEXIDINE GLUCONATE 0.12 % MT SOLN: 15.0000 mL | Freq: Two times a day (BID) | OROMUCOSAL | 0 refills | Status: AC

## 2022-11-24 NOTE — ED Triage Notes (Signed)
Pt via POV from home. Pt states he noticed foul-smelling breath for the past week. States its been going on a week. Denies pain. Denies swelling. Pt is A&Ox4 and NAD. Ambulatory to triage.

## 2022-11-24 NOTE — Discharge Instructions (Signed)
It is very important a follow-up with a dentist given your multiple teeth that appear to have cavities and decay.  Please return for any new, worsening, or change in symptoms or other concerns.  It was a pleasure caring for you today.

## 2022-11-24 NOTE — ED Provider Notes (Signed)
[facility] Provider Note    Event Date/Time   First MD Initiated Contact with Patient 11/24/22 [phone]     (approximate)   History   Dental Problem   HPI  Anthony Nunez is a 32 y.o. male who presents today for evaluation of halitosis.  Patient reports that several people have told him that his breath smells recently.  He denies any pain inside his mouth.  No sore throat.  No fevers or chills.  No difficulty eating or drinking.  No difficulty swallowing.  No voice change.  There are no problems to display for this patient.         Physical Exam   Triage Vital Signs: ED Triage Vitals  Encounter Vitals Group     BP 11/24/22 0736 (!) 157/91     Systolic BP Percentile --      Diastolic BP Percentile --      Pulse Rate 11/24/22 0736 77     Resp 11/24/22 0736 20     Temp 11/24/22 0736 98 F (36.7 C)     Temp Source 11/24/22 0736 Oral     SpO2 11/24/22 0736 98 %     Weight 11/24/22 0729 180 lb (81.6 kg)     Height 11/24/22 0729 5\' 10"  (1.778 m)     Head Circumference --      Peak Flow --      Pain Score 11/24/22 0728 0     Pain Loc --      Pain Education --      Exclude from Growth Chart --     Most recent vital signs: Vitals:   11/24/22 0736  BP: (!) 157/91  Pulse: 77  Resp: 20  Temp: 98 F (36.7 C)  SpO2: 98%    Physical Exam Vitals and nursing note reviewed.  Constitutional:      General: Awake and alert. No acute distress.    Appearance: Normal appearance. The patient is normal weight.  HENT:     Head: Normocephalic and atraumatic.     Mouth: Mucous membranes are moist.  Numerous decayed and missing teeth.  No gingival fluctuance or tenderness.  No tap tenderness to any teeth.  No sublingual swelling.  No facial or neck swelling or erythema.  No trismus.  No drooling. No tonsillar exudate. Eyes:     General: PERRL. Normal EOMs        Right eye: No discharge.        Left eye: No discharge.     Conjunctiva/sclera:  Conjunctivae normal.  Cardiovascular:     Rate and Rhythm: Normal rate and regular rhythm.     Pulses: Normal pulses.  Pulmonary:     Effort: Pulmonary effort is normal. No respiratory distress.     Breath sounds: Normal breath sounds.  Abdominal:     Abdomen is soft. There is no abdominal tenderness. No rebound or guarding. No distention. Musculoskeletal:        General: No swelling. Normal range of motion.     Cervical back: Normal range of motion and neck supple.  Skin:    General: Skin is warm and dry.     Capillary Refill: Capillary refill takes less than 2 seconds.     Findings: No rash.  Neurological:     Mental Status: The patient is awake and alert.      ED Results / Procedures / Treatments   Labs (all labs ordered are listed, but only abnormal results  are displayed) Labs Reviewed - No data to display   EKG     RADIOLOGY     PROCEDURES:  Critical Care performed:   Procedures   MEDICATIONS ORDERED IN ED: Medications - No data to display   IMPRESSION / MDM / ASSESSMENT AND PLAN / ED COURSE  I reviewed the triage vital signs and the nursing notes.   Differential diagnosis includes, but is not limited to, dental caries, pulpitis, dental decay.  Patient was evaluated in the emergency department for dental pain. Patient has poor dentition diffusely with multiple missing and decayed teeth. No gingival swelling or fluctuance concerning for gingival abscess.  No trismus, nuchal rigidity, neck pain, hot potato voice, uvular deviation or malocclusion to suggest deep space infection. No sublingual swelling concerning for Ludwig's angina.  Patient was started on chlorhexidine mouth rinse.  No antibiotics were given given that there does not appear to be any infection at this time.  He was given a list of low-cost dental clinics and encouraged to follow-up with a dentist.  No tonsillar exudate or sore throat to suggest strep pharyngitis.  Discussed care plan, return  precautions, and advised close outpatient follow-up with dentist. Patient agrees with plan of care.   Patient's presentation is most consistent with acute, uncomplicated illness.   FINAL CLINICAL IMPRESSION(S) / ED DIAGNOSES   Final diagnoses:  Dental caries     Rx / DC Orders   ED Discharge Orders          Ordered    chlorhexidine (PERIDEX) 0.12 % solution  2 times daily        11/24/22 9604             Note:  This document was prepared using Dragon voice recognition software and may include unintentional dictation errors.   Keturah Shavers 11/24/22 1349    Willy Eddy, MD 11/24/22 1407
